# Patient Record
Sex: Female | Born: 1955
Health system: Southern US, Community
[De-identification: ages and names within clinical notes are randomized; demographics above are authoritative.]

## PROBLEM LIST (undated history)

## (undated) DIAGNOSIS — H269 Unspecified cataract: Secondary | ICD-10-CM

## (undated) DIAGNOSIS — I4891 Unspecified atrial fibrillation: Secondary | ICD-10-CM

## (undated) HISTORY — PX: ABDOMINAL HYSTERECTOMY: SHX81

## (undated) HISTORY — PX: CERVICAL DISC ARTHROPLASTY: SHX587

## (undated) HISTORY — PX: RIB FRACTURE SURGERY: SHX2358

## (undated) HISTORY — DX: Unspecified cataract: H26.9

## (undated) HISTORY — DX: Unspecified atrial fibrillation: I48.91

---

## 1998-04-06 ENCOUNTER — Other Ambulatory Visit: Admission: RE | Admit: 1998-04-06 | Discharge: 1998-04-06 | Payer: Self-pay | Admitting: *Deleted

## 1998-04-16 ENCOUNTER — Ambulatory Visit (HOSPITAL_COMMUNITY): Admission: RE | Admit: 1998-04-16 | Discharge: 1998-04-16 | Payer: Self-pay | Admitting: Neurological Surgery

## 1999-05-12 ENCOUNTER — Encounter (INDEPENDENT_AMBULATORY_CARE_PROVIDER_SITE_OTHER): Payer: Self-pay

## 1999-05-12 ENCOUNTER — Ambulatory Visit (HOSPITAL_COMMUNITY): Admission: RE | Admit: 1999-05-12 | Discharge: 1999-05-12 | Payer: Self-pay | Admitting: *Deleted

## 1999-05-30 ENCOUNTER — Other Ambulatory Visit: Admission: RE | Admit: 1999-05-30 | Discharge: 1999-05-30 | Payer: Self-pay | Admitting: *Deleted

## 1999-10-25 ENCOUNTER — Encounter: Admission: RE | Admit: 1999-10-25 | Discharge: 2000-01-23 | Payer: Self-pay | Admitting: Anesthesiology

## 1999-11-21 ENCOUNTER — Encounter: Admission: RE | Admit: 1999-11-21 | Discharge: 2000-02-19 | Payer: Self-pay | Admitting: Family Medicine

## 2000-04-23 ENCOUNTER — Encounter: Payer: Self-pay | Admitting: Family Medicine

## 2000-04-23 ENCOUNTER — Encounter: Admission: RE | Admit: 2000-04-23 | Discharge: 2000-04-23 | Payer: Self-pay | Admitting: Family Medicine

## 2000-06-26 ENCOUNTER — Other Ambulatory Visit: Admission: RE | Admit: 2000-06-26 | Discharge: 2000-06-26 | Payer: Self-pay | Admitting: *Deleted

## 2000-11-29 ENCOUNTER — Other Ambulatory Visit: Admission: RE | Admit: 2000-11-29 | Discharge: 2000-11-29 | Payer: Self-pay | Admitting: *Deleted

## 2000-11-29 ENCOUNTER — Encounter (INDEPENDENT_AMBULATORY_CARE_PROVIDER_SITE_OTHER): Payer: Self-pay

## 2002-01-19 ENCOUNTER — Other Ambulatory Visit: Admission: RE | Admit: 2002-01-19 | Discharge: 2002-01-19 | Payer: Self-pay | Admitting: *Deleted

## 2002-05-07 ENCOUNTER — Emergency Department (HOSPITAL_COMMUNITY): Admission: EM | Admit: 2002-05-07 | Discharge: 2002-05-08 | Payer: Self-pay | Admitting: Emergency Medicine

## 2002-08-10 ENCOUNTER — Ambulatory Visit (HOSPITAL_COMMUNITY): Admission: RE | Admit: 2002-08-10 | Discharge: 2002-08-10 | Payer: Self-pay | Admitting: *Deleted

## 2002-08-10 ENCOUNTER — Encounter (INDEPENDENT_AMBULATORY_CARE_PROVIDER_SITE_OTHER): Payer: Self-pay

## 2002-11-19 ENCOUNTER — Encounter: Payer: Self-pay | Admitting: Family Medicine

## 2002-11-19 ENCOUNTER — Encounter: Admission: RE | Admit: 2002-11-19 | Discharge: 2002-11-19 | Payer: Self-pay | Admitting: Family Medicine

## 2005-03-14 ENCOUNTER — Ambulatory Visit (HOSPITAL_COMMUNITY): Admission: RE | Admit: 2005-03-14 | Discharge: 2005-03-14 | Payer: Self-pay | Admitting: Orthopedic Surgery

## 2009-01-25 ENCOUNTER — Ambulatory Visit: Payer: Self-pay | Admitting: Cardiovascular Disease

## 2009-01-25 ENCOUNTER — Inpatient Hospital Stay (HOSPITAL_COMMUNITY): Admission: EM | Admit: 2009-01-25 | Discharge: 2009-02-18 | Payer: Self-pay | Admitting: Emergency Medicine

## 2009-01-25 ENCOUNTER — Ambulatory Visit: Payer: Self-pay | Admitting: Pulmonary Disease

## 2009-01-28 ENCOUNTER — Encounter (INDEPENDENT_AMBULATORY_CARE_PROVIDER_SITE_OTHER): Payer: Self-pay | Admitting: Ophthalmology

## 2009-02-01 ENCOUNTER — Ambulatory Visit: Payer: Self-pay | Admitting: Thoracic Surgery

## 2009-03-02 ENCOUNTER — Encounter: Payer: Self-pay | Admitting: Cardiology

## 2009-03-02 ENCOUNTER — Encounter: Admission: RE | Admit: 2009-03-02 | Discharge: 2009-03-02 | Payer: Self-pay | Admitting: Thoracic Surgery

## 2009-03-02 ENCOUNTER — Ambulatory Visit: Payer: Self-pay | Admitting: Thoracic Surgery

## 2009-03-02 DIAGNOSIS — T148XXA Other injury of unspecified body region, initial encounter: Secondary | ICD-10-CM | POA: Insufficient documentation

## 2009-03-02 DIAGNOSIS — D62 Acute posthemorrhagic anemia: Secondary | ICD-10-CM | POA: Insufficient documentation

## 2009-03-02 DIAGNOSIS — I4891 Unspecified atrial fibrillation: Secondary | ICD-10-CM | POA: Insufficient documentation

## 2009-03-03 ENCOUNTER — Ambulatory Visit: Payer: Self-pay | Admitting: Cardiology

## 2009-03-23 ENCOUNTER — Ambulatory Visit: Payer: Self-pay | Admitting: Thoracic Surgery

## 2009-03-23 ENCOUNTER — Encounter: Admission: RE | Admit: 2009-03-23 | Discharge: 2009-03-23 | Payer: Self-pay | Admitting: Thoracic Surgery

## 2009-03-24 ENCOUNTER — Telehealth (INDEPENDENT_AMBULATORY_CARE_PROVIDER_SITE_OTHER): Payer: Self-pay | Admitting: *Deleted

## 2009-05-04 ENCOUNTER — Encounter: Admission: RE | Admit: 2009-05-04 | Discharge: 2009-05-04 | Payer: Self-pay | Admitting: Thoracic Surgery

## 2009-05-04 ENCOUNTER — Ambulatory Visit: Payer: Self-pay | Admitting: Thoracic Surgery

## 2009-07-13 ENCOUNTER — Ambulatory Visit: Payer: Self-pay | Admitting: Thoracic Surgery

## 2009-07-13 ENCOUNTER — Encounter: Admission: RE | Admit: 2009-07-13 | Discharge: 2009-07-13 | Payer: Self-pay | Admitting: Thoracic Surgery

## 2009-10-11 ENCOUNTER — Ambulatory Visit: Payer: Self-pay | Admitting: Thoracic Surgery

## 2009-10-11 ENCOUNTER — Encounter: Admission: RE | Admit: 2009-10-11 | Discharge: 2009-10-11 | Payer: Self-pay | Admitting: Thoracic Surgery

## 2010-03-21 ENCOUNTER — Encounter: Admission: RE | Admit: 2010-03-21 | Discharge: 2010-03-21 | Payer: Self-pay | Admitting: Thoracic Surgery

## 2010-03-21 ENCOUNTER — Ambulatory Visit: Payer: Self-pay | Admitting: Thoracic Surgery

## 2011-01-31 LAB — GLUCOSE, CAPILLARY
Glucose-Capillary: 100 mg/dL — ABNORMAL HIGH (ref 70–99)
Glucose-Capillary: 102 mg/dL — ABNORMAL HIGH (ref 70–99)
Glucose-Capillary: 104 mg/dL — ABNORMAL HIGH (ref 70–99)
Glucose-Capillary: 106 mg/dL — ABNORMAL HIGH (ref 70–99)
Glucose-Capillary: 108 mg/dL — ABNORMAL HIGH (ref 70–99)
Glucose-Capillary: 113 mg/dL — ABNORMAL HIGH (ref 70–99)
Glucose-Capillary: 114 mg/dL — ABNORMAL HIGH (ref 70–99)
Glucose-Capillary: 121 mg/dL — ABNORMAL HIGH (ref 70–99)
Glucose-Capillary: 122 mg/dL — ABNORMAL HIGH (ref 70–99)
Glucose-Capillary: 124 mg/dL — ABNORMAL HIGH (ref 70–99)
Glucose-Capillary: 124 mg/dL — ABNORMAL HIGH (ref 70–99)
Glucose-Capillary: 124 mg/dL — ABNORMAL HIGH (ref 70–99)
Glucose-Capillary: 125 mg/dL — ABNORMAL HIGH (ref 70–99)
Glucose-Capillary: 126 mg/dL — ABNORMAL HIGH (ref 70–99)
Glucose-Capillary: 130 mg/dL — ABNORMAL HIGH (ref 70–99)
Glucose-Capillary: 132 mg/dL — ABNORMAL HIGH (ref 70–99)
Glucose-Capillary: 138 mg/dL — ABNORMAL HIGH (ref 70–99)
Glucose-Capillary: 142 mg/dL — ABNORMAL HIGH (ref 70–99)
Glucose-Capillary: 143 mg/dL — ABNORMAL HIGH (ref 70–99)
Glucose-Capillary: 147 mg/dL — ABNORMAL HIGH (ref 70–99)
Glucose-Capillary: 149 mg/dL — ABNORMAL HIGH (ref 70–99)
Glucose-Capillary: 151 mg/dL — ABNORMAL HIGH (ref 70–99)
Glucose-Capillary: 191 mg/dL — ABNORMAL HIGH (ref 70–99)
Glucose-Capillary: 80 mg/dL (ref 70–99)
Glucose-Capillary: 85 mg/dL (ref 70–99)
Glucose-Capillary: 96 mg/dL (ref 70–99)
Glucose-Capillary: 115 mg/dL — ABNORMAL HIGH (ref 70–99)
Glucose-Capillary: 117 mg/dL — ABNORMAL HIGH (ref 70–99)
Glucose-Capillary: 118 mg/dL — ABNORMAL HIGH (ref 70–99)
Glucose-Capillary: 120 mg/dL — ABNORMAL HIGH (ref 70–99)
Glucose-Capillary: 125 mg/dL — ABNORMAL HIGH (ref 70–99)
Glucose-Capillary: 127 mg/dL — ABNORMAL HIGH (ref 70–99)
Glucose-Capillary: 128 mg/dL — ABNORMAL HIGH (ref 70–99)
Glucose-Capillary: 128 mg/dL — ABNORMAL HIGH (ref 70–99)
Glucose-Capillary: 129 mg/dL — ABNORMAL HIGH (ref 70–99)
Glucose-Capillary: 129 mg/dL — ABNORMAL HIGH (ref 70–99)
Glucose-Capillary: 129 mg/dL — ABNORMAL HIGH (ref 70–99)
Glucose-Capillary: 130 mg/dL — ABNORMAL HIGH (ref 70–99)
Glucose-Capillary: 137 mg/dL — ABNORMAL HIGH (ref 70–99)
Glucose-Capillary: 137 mg/dL — ABNORMAL HIGH (ref 70–99)
Glucose-Capillary: 144 mg/dL — ABNORMAL HIGH (ref 70–99)
Glucose-Capillary: 146 mg/dL — ABNORMAL HIGH (ref 70–99)
Glucose-Capillary: 146 mg/dL — ABNORMAL HIGH (ref 70–99)
Glucose-Capillary: 146 mg/dL — ABNORMAL HIGH (ref 70–99)
Glucose-Capillary: 146 mg/dL — ABNORMAL HIGH (ref 70–99)
Glucose-Capillary: 147 mg/dL — ABNORMAL HIGH (ref 70–99)
Glucose-Capillary: 159 mg/dL — ABNORMAL HIGH (ref 70–99)
Glucose-Capillary: 163 mg/dL — ABNORMAL HIGH (ref 70–99)
Glucose-Capillary: 79 mg/dL (ref 70–99)
Glucose-Capillary: 82 mg/dL (ref 70–99)
Glucose-Capillary: 88 mg/dL (ref 70–99)
Glucose-Capillary: 96 mg/dL (ref 70–99)

## 2011-01-31 LAB — BASIC METABOLIC PANEL
BUN: 20 mg/dL (ref 6–23)
BUN: 10 mg/dL (ref 6–23)
BUN: 17 mg/dL (ref 6–23)
BUN: 18 mg/dL (ref 6–23)
BUN: 8 mg/dL (ref 6–23)
BUN: 8 mg/dL (ref 6–23)
CO2: 26 mEq/L (ref 19–32)
CO2: 26 mEq/L (ref 19–32)
CO2: 30 mEq/L (ref 19–32)
CO2: 30 mEq/L (ref 19–32)
Calcium: 7.6 mg/dL — ABNORMAL LOW (ref 8.4–10.5)
Calcium: 7.8 mg/dL — ABNORMAL LOW (ref 8.4–10.5)
Calcium: 6.7 mg/dL — ABNORMAL LOW (ref 8.4–10.5)
Calcium: 7.5 mg/dL — ABNORMAL LOW (ref 8.4–10.5)
Calcium: 7.7 mg/dL — ABNORMAL LOW (ref 8.4–10.5)
Calcium: 8 mg/dL — ABNORMAL LOW (ref 8.4–10.5)
Calcium: 8.2 mg/dL — ABNORMAL LOW (ref 8.4–10.5)
Calcium: 8.2 mg/dL — ABNORMAL LOW (ref 8.4–10.5)
Chloride: 102 mEq/L (ref 96–112)
Chloride: 106 mEq/L (ref 96–112)
Chloride: 107 mEq/L (ref 96–112)
Chloride: 111 mEq/L (ref 96–112)
Chloride: 111 mEq/L (ref 96–112)
Chloride: 113 mEq/L — ABNORMAL HIGH (ref 96–112)
Creatinine, Ser: 0.38 mg/dL — ABNORMAL LOW (ref 0.4–1.2)
Creatinine, Ser: 0.45 mg/dL (ref 0.4–1.2)
Creatinine, Ser: 0.5 mg/dL (ref 0.4–1.2)
Creatinine, Ser: 0.41 mg/dL (ref 0.4–1.2)
Creatinine, Ser: 0.46 mg/dL (ref 0.4–1.2)
Creatinine, Ser: 0.47 mg/dL (ref 0.4–1.2)
Creatinine, Ser: 0.5 mg/dL (ref 0.4–1.2)
Creatinine, Ser: 0.52 mg/dL (ref 0.4–1.2)
Creatinine, Ser: 0.54 mg/dL (ref 0.4–1.2)
Creatinine, Ser: 0.61 mg/dL (ref 0.4–1.2)
GFR calc Af Amer: >60 mL/min (ref >60)
GFR calc Af Amer: >60 mL/min (ref >60)
GFR calc Af Amer: >60 mL/min (ref >60)
GFR calc Af Amer: >60 mL/min (ref >60)
GFR calc Af Amer: >60 mL/min (ref >60)
GFR calc Af Amer: >60 mL/min (ref >60)
GFR calc Af Amer: >60 mL/min (ref >60)
GFR calc Af Amer: >60 mL/min (ref >60)
GFR calc Af Amer: >60 mL/min (ref >60)
GFR calc Af Amer: >60 mL/min (ref >60)
GFR calc Af Amer: >60 mL/min (ref >60)
GFR calc non Af Amer: >60 mL/min (ref >60)
GFR calc non Af Amer: >60 mL/min (ref >60)
GFR calc non Af Amer: >60 mL/min (ref >60)
GFR calc non Af Amer: >60 mL/min (ref >60)
GFR calc non Af Amer: >60 mL/min (ref >60)
GFR calc non Af Amer: >60 mL/min (ref >60)
GFR calc non Af Amer: >60 mL/min (ref >60)
GFR calc non Af Amer: >60 mL/min (ref >60)
GFR calc non Af Amer: >60 mL/min (ref >60)
Glucose, Bld: 121 mg/dL — ABNORMAL HIGH (ref 70–99)
Glucose, Bld: 133 mg/dL — ABNORMAL HIGH (ref 70–99)
Glucose, Bld: 138 mg/dL — ABNORMAL HIGH (ref 70–99)
Glucose, Bld: 141 mg/dL — ABNORMAL HIGH (ref 70–99)
Glucose, Bld: 150 mg/dL — ABNORMAL HIGH (ref 70–99)
Potassium: 3.5 mEq/L (ref 3.5–5.1)
Potassium: 3.7 mEq/L (ref 3.5–5.1)
Potassium: 4.1 mEq/L (ref 3.5–5.1)
Potassium: 3.5 mEq/L (ref 3.5–5.1)
Potassium: 3.9 mEq/L (ref 3.5–5.1)
Potassium: 4 mEq/L (ref 3.5–5.1)
Potassium: 4.1 mEq/L (ref 3.5–5.1)
Sodium: 138 mEq/L (ref 135–145)
Sodium: 138 mEq/L (ref 135–145)
Sodium: 143 mEq/L (ref 135–145)
Sodium: 144 mEq/L (ref 135–145)
Sodium: 145 mEq/L (ref 135–145)
Sodium: 146 mEq/L — ABNORMAL HIGH (ref 135–145)

## 2011-01-31 LAB — CBC
HCT: 22.5 % — ABNORMAL LOW (ref 36.0–46.0)
HCT: 22.8 % — ABNORMAL LOW (ref 36.0–46.0)
HCT: 26.3 % — ABNORMAL LOW (ref 36.0–46.0)
HCT: 30 % — ABNORMAL LOW (ref 36.0–46.0)
HCT: 23 % — ABNORMAL LOW (ref 36.0–46.0)
HCT: 25.4 % — ABNORMAL LOW (ref 36.0–46.0)
HCT: 25.8 % — ABNORMAL LOW (ref 36.0–46.0)
HCT: 32.2 % — ABNORMAL LOW (ref 36.0–46.0)
Hemoglobin: 8.2 g/dL — ABNORMAL LOW (ref 12.0–15.0)
Hemoglobin: 8.4 g/dL — ABNORMAL LOW (ref 12.0–15.0)
Hemoglobin: 9.9 g/dL — ABNORMAL LOW (ref 12.0–15.0)
Hemoglobin: 7.9 g/dL — CL (ref 12.0–15.0)
Hemoglobin: 8 g/dL — ABNORMAL LOW (ref 12.0–15.0)
Hemoglobin: 8.5 g/dL — ABNORMAL LOW (ref 12.0–15.0)
Hemoglobin: 8.8 g/dL — ABNORMAL LOW (ref 12.0–15.0)
MCHC: 33.7 g/dL (ref 30.0–36.0)
MCHC: 33.9 g/dL (ref 30.0–36.0)
MCHC: 34.3 g/dL (ref 30.0–36.0)
MCHC: 35 g/dL (ref 30.0–36.0)
MCHC: 34.6 g/dL (ref 30.0–36.0)
MCHC: 34.7 g/dL (ref 30.0–36.0)
MCHC: 34.9 g/dL (ref 30.0–36.0)
MCV: 93.8 fL (ref 78.0–100.0)
MCV: 94.7 fL (ref 78.0–100.0)
MCV: 95.1 fL (ref 78.0–100.0)
MCV: 95.3 fL (ref 78.0–100.0)
MCV: 96.1 fL (ref 78.0–100.0)
MCV: 96.3 fL (ref 78.0–100.0)
MCV: 96.7 fL (ref 78.0–100.0)
MCV: 97.8 fL (ref 78.0–100.0)
MCV: 98.2 fL (ref 78.0–100.0)
MCV: 98.3 fL (ref 78.0–100.0)
MCV: 99 fL (ref 78.0–100.0)
Platelets: 271 10*3/uL (ref 150–400)
Platelets: 121 10*3/uL — ABNORMAL LOW (ref 150–400)
Platelets: 132 10*3/uL — ABNORMAL LOW (ref 150–400)
Platelets: 168 10*3/uL (ref 150–400)
Platelets: 176 10*3/uL (ref 150–400)
Platelets: 178 10*3/uL (ref 150–400)
Platelets: 250 10*3/uL (ref 150–400)
Platelets: 256 10*3/uL (ref 150–400)
RBC: 2.34 MIL/uL — ABNORMAL LOW (ref 3.87–5.11)
RBC: 2.37 MIL/uL — ABNORMAL LOW (ref 3.87–5.11)
RBC: 2.49 MIL/uL — ABNORMAL LOW (ref 3.87–5.11)
RBC: 2.63 MIL/uL — ABNORMAL LOW (ref 3.87–5.11)
RBC: 2.78 MIL/uL — ABNORMAL LOW (ref 3.87–5.11)
RBC: 3.11 MIL/uL — ABNORMAL LOW (ref 3.87–5.11)
RBC: 3.16 MIL/uL — ABNORMAL LOW (ref 3.87–5.11)
RBC: 2.38 MIL/uL — ABNORMAL LOW (ref 3.87–5.11)
RBC: 2.52 MIL/uL — ABNORMAL LOW (ref 3.87–5.11)
RBC: 2.63 MIL/uL — ABNORMAL LOW (ref 3.87–5.11)
RBC: 2.64 MIL/uL — ABNORMAL LOW (ref 3.87–5.11)
RBC: 3.47 MIL/uL — ABNORMAL LOW (ref 3.87–5.11)
RDW: 15 % (ref 11.5–15.5)
RDW: 14.1 % (ref 11.5–15.5)
RDW: 14.4 % (ref 11.5–15.5)
RDW: 14.6 % (ref 11.5–15.5)
RDW: 14.6 % (ref 11.5–15.5)
RDW: 15.1 % (ref 11.5–15.5)
RDW: 15.3 % (ref 11.5–15.5)
RDW: 15.6 % — ABNORMAL HIGH (ref 11.5–15.5)
RDW: 15.8 % — ABNORMAL HIGH (ref 11.5–15.5)
WBC: 10.1 10*3/uL (ref 4.0–10.5)
WBC: 10.4 10*3/uL (ref 4.0–10.5)
WBC: 10.7 10*3/uL — ABNORMAL HIGH (ref 4.0–10.5)
WBC: 11.3 10*3/uL — ABNORMAL HIGH (ref 4.0–10.5)
WBC: 11.3 10*3/uL — ABNORMAL HIGH (ref 4.0–10.5)
WBC: 12.1 10*3/uL — ABNORMAL HIGH (ref 4.0–10.5)
WBC: 8 10*3/uL (ref 4.0–10.5)
WBC: 9.8 10*3/uL (ref 4.0–10.5)
WBC: 10.3 10*3/uL (ref 4.0–10.5)
WBC: 6.2 10*3/uL (ref 4.0–10.5)
WBC: 9.1 10*3/uL (ref 4.0–10.5)
WBC: 9.3 10*3/uL (ref 4.0–10.5)

## 2011-01-31 LAB — BLOOD GAS, ARTERIAL
Acid-Base Excess: 2.8 mmol/L — ABNORMAL HIGH (ref 0.0–2.0)
Acid-Base Excess: 5.4 mmol/L — ABNORMAL HIGH (ref 0.0–2.0)
Acid-base deficit: 1.8 mmol/L (ref 0.0–2.0)
Acid-base deficit: 2.8 mmol/L — ABNORMAL HIGH (ref 0.0–2.0)
Bicarbonate: 22.2 mEq/L (ref 20.0–24.0)
Bicarbonate: 22.5 mEq/L (ref 20.0–24.0)
Bicarbonate: 27.3 mEq/L — ABNORMAL HIGH (ref 20.0–24.0)
Bicarbonate: 29.7 mEq/L — ABNORMAL HIGH (ref 20.0–24.0)
Bicarbonate: 29.8 mEq/L — ABNORMAL HIGH (ref 20.0–24.0)
FIO2: 0.4 %
FIO2: 0.4 %
FIO2: 0.5 %
MECHVT: 450 mL
MECHVT: 450 mL
MECHVT: 450 mL
MECHVT: 450 mL
MECHVT: 500 mL
MECHVT: 500 mL
MECHVT: 500 mL
O2 Saturation: 91.9 %
O2 Saturation: 95.9 %
O2 Saturation: 96.1 %
O2 Saturation: 96.4 %
O2 Saturation: 97 %
O2 Saturation: 97.7 %
PEEP: 0.5 cmH2O
PEEP: 5 cmH2O
PEEP: 5 cmH2O
PEEP: 5 cmH2O
PEEP: 5 cmH2O
PEEP: 8 cmH2O
PEEP: 8 cmH2O
Patient temperature: 100.9
Patient temperature: 97.4
Patient temperature: 98.6
Patient temperature: 98.6
Patient temperature: 98.6
RATE: 16 resp/min
RATE: 16 resp/min
RATE: 18 resp/min
TCO2: 23.5 mmol/L (ref 0–100)
TCO2: 23.6 mmol/L (ref 0–100)
TCO2: 31.2 mmol/L (ref 0–100)
TCO2: 31.3 mmol/L (ref 0–100)
pCO2 arterial: 41.7 mmHg (ref 35.0–45.0)
pCO2 arterial: 42.8 mmHg (ref 35.0–45.0)
pCO2 arterial: 44 mmHg (ref 35.0–45.0)
pCO2 arterial: 45.8 mmHg — ABNORMAL HIGH (ref 35.0–45.0)
pCO2 arterial: 50.7 mmHg — ABNORMAL HIGH (ref 35.0–45.0)
pH, Arterial: 7.335 — ABNORMAL LOW (ref 7.350–7.400)
pH, Arterial: 7.392 (ref 7.350–7.400)
pH, Arterial: 7.44 — ABNORMAL HIGH (ref 7.350–7.400)
pO2, Arterial: 64.8 mmHg — ABNORMAL LOW (ref 80.0–100.0)
pO2, Arterial: 65.8 mmHg — ABNORMAL LOW (ref 80.0–100.0)
pO2, Arterial: 72.5 mmHg — ABNORMAL LOW (ref 80.0–100.0)
pO2, Arterial: 79 mmHg — ABNORMAL LOW (ref 80.0–100.0)
pO2, Arterial: 85.3 mmHg (ref 80.0–100.0)

## 2011-01-31 LAB — POCT I-STAT 3, ART BLOOD GAS (G3+)
Bicarbonate: 23.6 mEq/L (ref 20.0–24.0)
Bicarbonate: 24.4 mEq/L — ABNORMAL HIGH (ref 20.0–24.0)
O2 Saturation: 92 %
Patient temperature: 100.2
Patient temperature: 98.4
TCO2: 25 mmol/L (ref 0–100)
TCO2: 28 mmol/L (ref 0–100)
TCO2: 29 mmol/L (ref 0–100)
pCO2 arterial: 40.2 mmHg (ref 35.0–45.0)
pH, Arterial: 7.416 — ABNORMAL HIGH (ref 7.350–7.400)
pH, Arterial: 7.431 — ABNORMAL HIGH (ref 7.350–7.400)
pH, Arterial: 7.453 — ABNORMAL HIGH (ref 7.350–7.400)
pO2, Arterial: 66 mmHg — ABNORMAL LOW (ref 80.0–100.0)
pO2, Arterial: 75 mmHg — ABNORMAL LOW (ref 80.0–100.0)
pO2, Arterial: 84 mmHg (ref 80.0–100.0)

## 2011-01-31 LAB — COMPREHENSIVE METABOLIC PANEL
AST: 12 U/L (ref 0–37)
AST: 37 U/L (ref 0–37)
Albumin: 1.2 g/dL — ABNORMAL LOW (ref 3.5–5.2)
Albumin: 1.4 g/dL — ABNORMAL LOW (ref 3.5–5.2)
Albumin: 2.1 g/dL — ABNORMAL LOW (ref 3.5–5.2)
Alkaline Phosphatase: 162 U/L — ABNORMAL HIGH (ref 39–117)
Alkaline Phosphatase: 82 U/L (ref 39–117)
BUN: 10 mg/dL (ref 6–23)
BUN: 14 mg/dL (ref 6–23)
CO2: 25 mEq/L (ref 19–32)
Calcium: 7.1 mg/dL — ABNORMAL LOW (ref 8.4–10.5)
Calcium: 7.2 mg/dL — ABNORMAL LOW (ref 8.4–10.5)
Chloride: 105 mEq/L (ref 96–112)
Creatinine, Ser: 0.45 mg/dL (ref 0.4–1.2)
Creatinine, Ser: 0.57 mg/dL (ref 0.4–1.2)
Creatinine, Ser: 0.57 mg/dL (ref 0.4–1.2)
GFR calc Af Amer: >60 mL/min (ref >60)
GFR calc Af Amer: >60 mL/min (ref >60)
GFR calc non Af Amer: >60 mL/min (ref >60)
GFR calc non Af Amer: >60 mL/min (ref >60)
Glucose, Bld: 128 mg/dL — ABNORMAL HIGH (ref 70–99)
Glucose, Bld: 150 mg/dL — ABNORMAL HIGH (ref 70–99)
Potassium: 3.8 mEq/L (ref 3.5–5.1)
Potassium: 3.9 mEq/L (ref 3.5–5.1)
Total Bilirubin: 0.4 mg/dL (ref 0.3–1.2)
Total Bilirubin: 0.5 mg/dL (ref 0.3–1.2)
Total Protein: 4.2 g/dL — ABNORMAL LOW (ref 6.0–8.3)
Total Protein: 4.2 g/dL — ABNORMAL LOW (ref 6.0–8.3)

## 2011-01-31 LAB — TYPE AND SCREEN

## 2011-01-31 LAB — DIFFERENTIAL
Basophils Absolute: 0 10*3/uL (ref 0.0–0.1)
Basophils Absolute: 0 10*3/uL (ref 0.0–0.1)
Basophils Relative: 0 % (ref 0–1)
Eosinophils Absolute: 0.1 10*3/uL (ref 0.0–0.7)
Eosinophils Relative: 2 % (ref 0–5)
Lymphocytes Relative: 13 % (ref 12–46)
Lymphocytes Relative: 18 % (ref 12–46)
Lymphocytes Relative: 18 % (ref 12–46)
Lymphs Abs: 1 10*3/uL (ref 0.7–4.0)
Lymphs Abs: 1.1 10*3/uL (ref 0.7–4.0)
Monocytes Absolute: 0.5 10*3/uL (ref 0.1–1.0)
Monocytes Absolute: 0.9 10*3/uL (ref 0.1–1.0)
Monocytes Relative: 5 % (ref 3–12)
Monocytes Relative: 10 % (ref 3–12)
Monocytes Relative: 14 % — ABNORMAL HIGH (ref 3–12)
Neutro Abs: 8.6 10*3/uL — ABNORMAL HIGH (ref 1.7–7.7)
Neutro Abs: 4 10*3/uL (ref 1.7–7.7)
Neutro Abs: 6.9 10*3/uL (ref 1.7–7.7)
Neutrophils Relative %: 85 % — ABNORMAL HIGH (ref 43–77)
Neutrophils Relative %: 65 % (ref 43–77)
Neutrophils Relative %: 76 % (ref 43–77)

## 2011-01-31 LAB — BRAIN NATRIURETIC PEPTIDE
Pro B Natriuretic peptide (BNP): 219 pg/mL — ABNORMAL HIGH (ref 0.0–100.0)
Pro B Natriuretic peptide (BNP): 249 pg/mL — ABNORMAL HIGH (ref 0.0–100.0)

## 2011-01-31 LAB — CULTURE, BLOOD (ROUTINE X 2)

## 2011-01-31 LAB — URINALYSIS, ROUTINE W REFLEX MICROSCOPIC
Bilirubin Urine: NEGATIVE
Glucose, UA: NEGATIVE mg/dL
Ketones, ur: NEGATIVE mg/dL
Ketones, ur: NEGATIVE mg/dL
Protein, ur: 30 mg/dL — AB
Protein, ur: NEGATIVE mg/dL
Urobilinogen, UA: 0.2 mg/dL (ref 0.0–1.0)
Urobilinogen, UA: 1 mg/dL (ref 0.0–1.0)

## 2011-01-31 LAB — CROSSMATCH
ABO/RH(D): O POS
ABO/RH(D): O POS
Antibody Screen: NEGATIVE

## 2011-01-31 LAB — POCT I-STAT 7, (LYTES, BLD GAS, ICA,H+H)
Acid-Base Excess: 7 mmol/L — ABNORMAL HIGH (ref 0.0–2.0)
Calcium, Ion: 1.1 mmol/L — ABNORMAL LOW (ref 1.12–1.32)
HCT: 23 % — ABNORMAL LOW (ref 36.0–46.0)
O2 Saturation: 99 %
Potassium: 3.7 mEq/L (ref 3.5–5.1)
Sodium: 137 mEq/L (ref 135–145)
pCO2 arterial: 42.7 mmHg (ref 35.0–45.0)
pH, Arterial: 7.473 — ABNORMAL HIGH (ref 7.350–7.400)
pO2, Arterial: 144 mmHg — ABNORMAL HIGH (ref 80.0–100.0)

## 2011-01-31 LAB — PROTIME-INR
INR: 1 (ref 0.00–1.49)
Prothrombin Time: 12.9 seconds (ref 11.6–15.2)

## 2011-01-31 LAB — URINE CULTURE
Colony Count: NO GROWTH
Colony Count: NO GROWTH
Special Requests: NEGATIVE

## 2011-01-31 LAB — HEMOGLOBIN A1C: Hgb A1c MFr Bld: 5.4 % (ref 4.6–6.1)

## 2011-01-31 LAB — RETICULOCYTES
RBC.: 2.52 MIL/uL — ABNORMAL LOW (ref 3.87–5.11)
RBC.: 2.63 MIL/uL — ABNORMAL LOW (ref 3.87–5.11)
RBC.: 3.19 MIL/uL — ABNORMAL LOW (ref 3.87–5.11)
Retic Count, Absolute: 55.2 10*3/uL (ref 19.0–186.0)
Retic Count, Absolute: 55.4 10*3/uL (ref 19.0–186.0)
Retic Ct Pct: 2.1 % (ref 0.4–3.1)
Retic Ct Pct: 3.3 % — ABNORMAL HIGH (ref 0.4–3.1)

## 2011-01-31 LAB — HEMOGLOBIN AND HEMATOCRIT, BLOOD
HCT: 24.1 % — ABNORMAL LOW (ref 36.0–46.0)
HCT: 26.1 % — ABNORMAL LOW (ref 36.0–46.0)
Hemoglobin: 8.5 g/dL — ABNORMAL LOW (ref 12.0–15.0)

## 2011-01-31 LAB — ABO/RH: ABO/RH(D): O POS

## 2011-01-31 LAB — POCT I-STAT, CHEM 8
BUN: 11 mg/dL (ref 6–23)
Chloride: 109 mEq/L (ref 96–112)
HCT: 44 % (ref 36.0–46.0)
Sodium: 139 mEq/L (ref 135–145)

## 2011-01-31 LAB — MAGNESIUM
Magnesium: 1.8 mg/dL (ref 1.5–2.5)
Magnesium: 2.4 mg/dL (ref 1.5–2.5)
Magnesium: 2.5 mg/dL (ref 1.5–2.5)

## 2011-01-31 LAB — CULTURE, BAL-QUANTITATIVE W GRAM STAIN: Colony Count: >=100000

## 2011-01-31 LAB — TSH: TSH: 2.985 u[IU]/mL (ref 0.350–4.500)

## 2011-01-31 LAB — POCT CARDIAC MARKERS
Myoglobin, poc: >500 ng/mL (ref 12–200)
Troponin i, poc: <0.05 ng/mL (ref 0.00–0.09)

## 2011-01-31 LAB — URINE MICROSCOPIC-ADD ON

## 2011-01-31 LAB — CK TOTAL AND CKMB (NOT AT ARMC): Total CK: 277 U/L — ABNORMAL HIGH (ref 7–177)

## 2011-01-31 LAB — VITAMIN B12: Vitamin B-12: 303 pg/mL (ref 211–911)

## 2011-02-04 IMAGING — CT CT ANGIO CHEST
2 of 6 series · 18 of 36 positions shown · IV contrast (APPLIED)
Comparison: Plain film chest earlier today.  Chest CT of
02/03/2009.

CLINICAL DATA: Unexplained hypoxemia.  Multiple injury from fall
01/25/2009.  Posterior shoulder pain.  The patient denies shortness
of breath or chest pain.

CT Angiography of the Chest.
TECHNIQUE: Multidetector CT angiography of the chest was performed
after contrast with bolus timed to evaluate the pulmonary arteries.
Multiplanar CT image reconstructions including MIPs were obtained
to evaluate the vascular anatomy.
Contrast:  100 ml [id number]

[Series 8: pulm embolism 1.0 b25f thins · axial · 0.64mm/px · z∈[-300,-33]mm · 17 of 297 slices shown]
[im 15/297  lung]
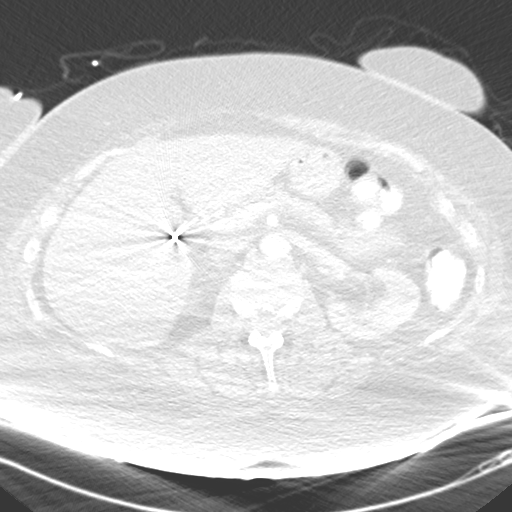
[im 30/297  mediastinal]
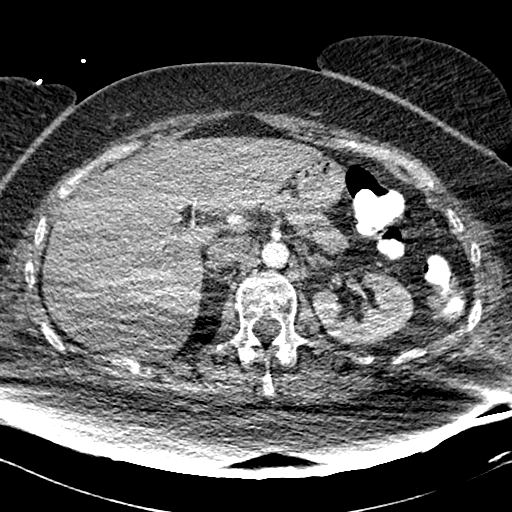
[im 45/297  lung]
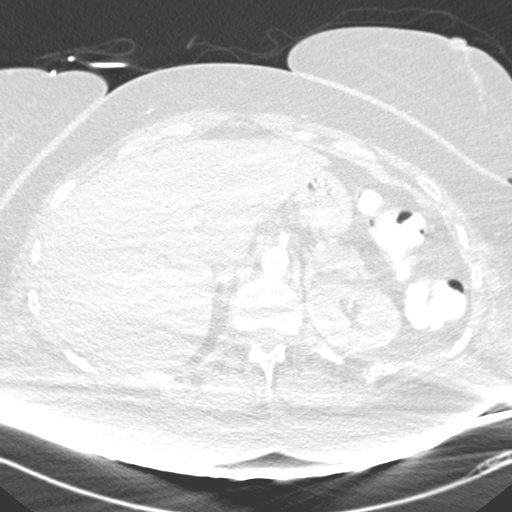
[im 60/297  mediastinal]
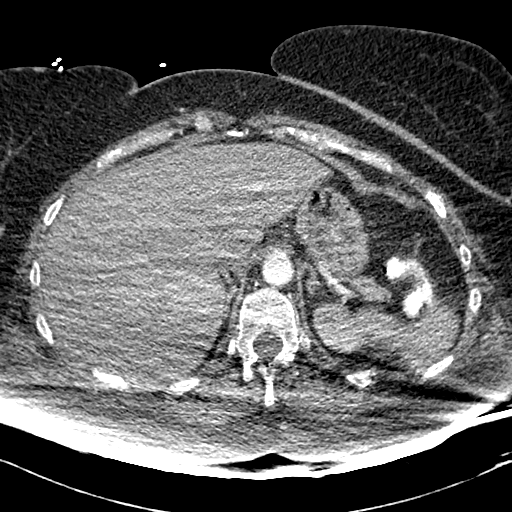
[im 89/297  lung]
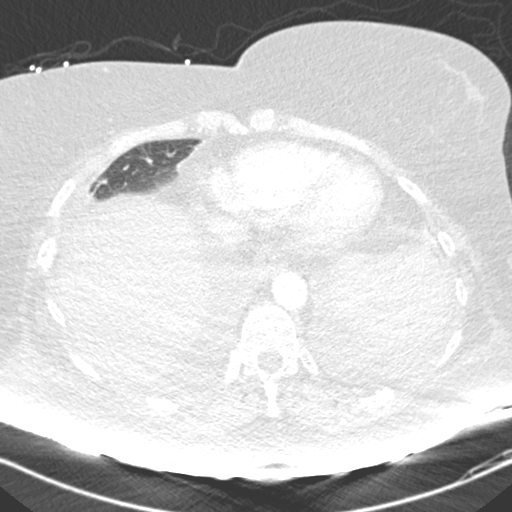
[im 104/297  mediastinal]
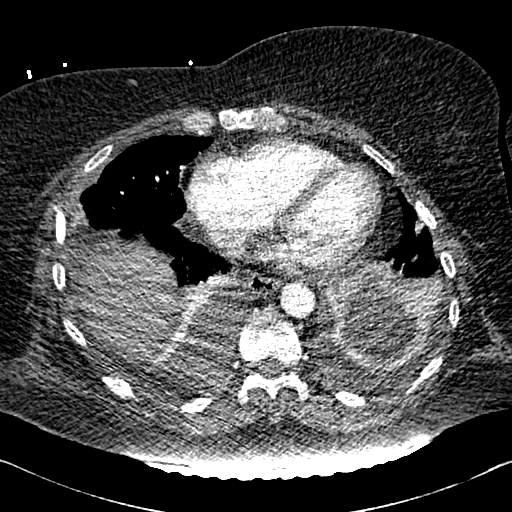
[im 119/297  lung]
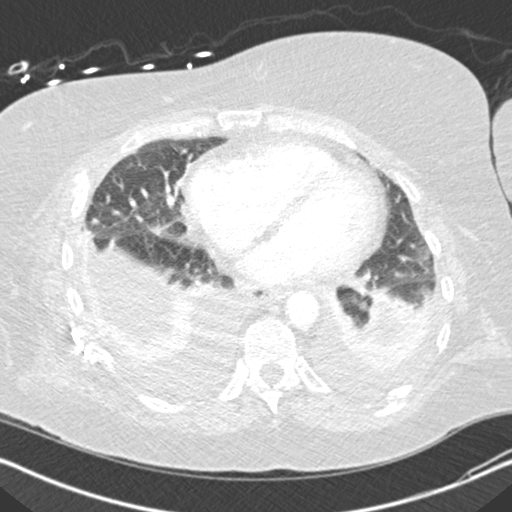
[im 134/297  mediastinal]
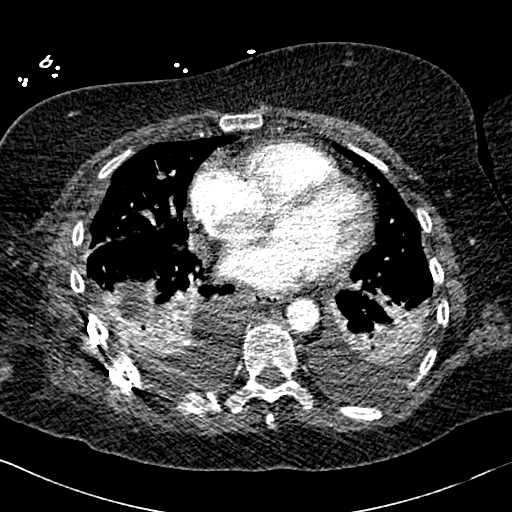
[im 149/297  lung]
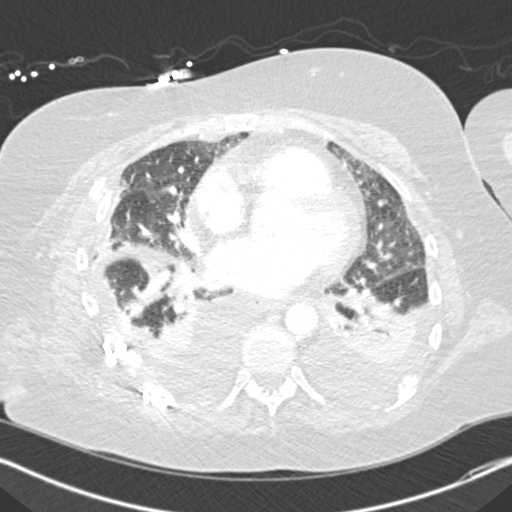
[im 163/297  mediastinal]
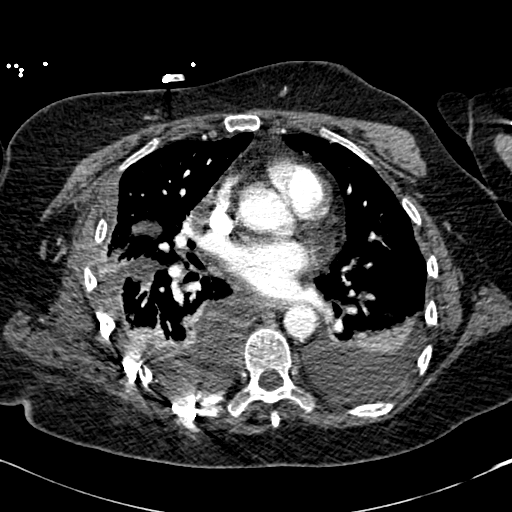
[im 178/297  lung]
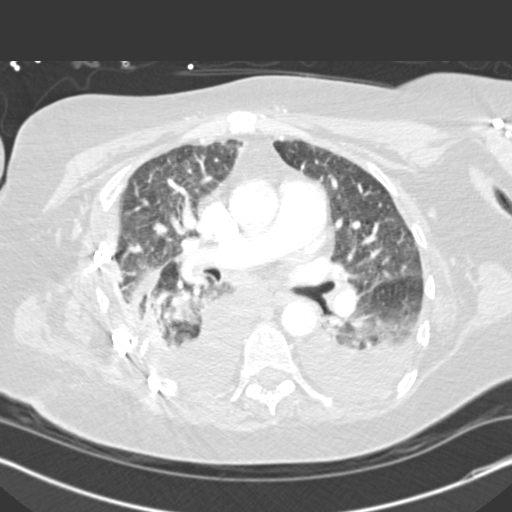
[im 193/297  mediastinal]
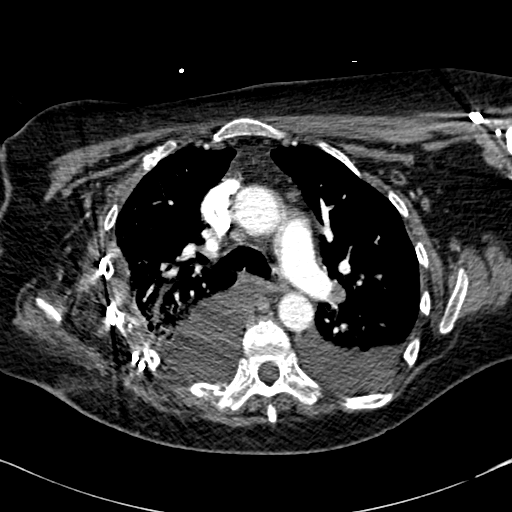
[im 208/297  lung]
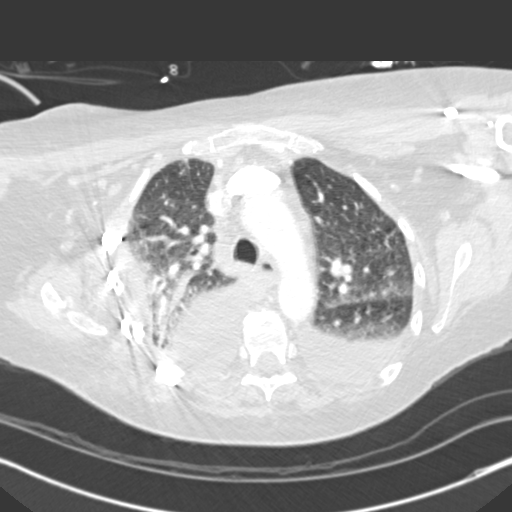
[im 237/297  mediastinal]
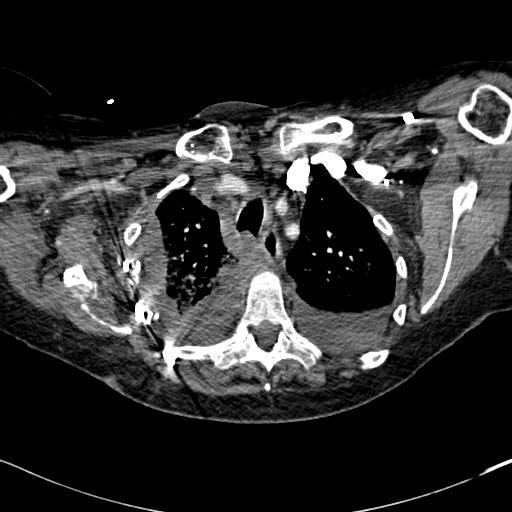
[im 252/297  lung]
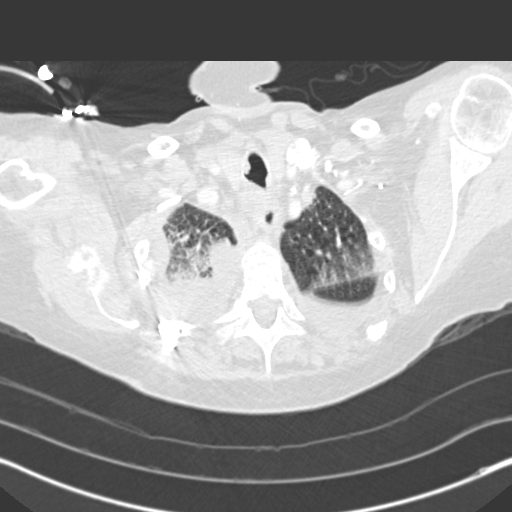
[im 267/297  mediastinal]
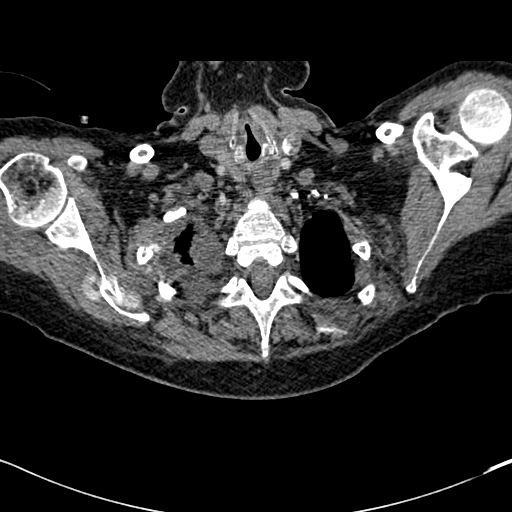
[im 282/297  lung]
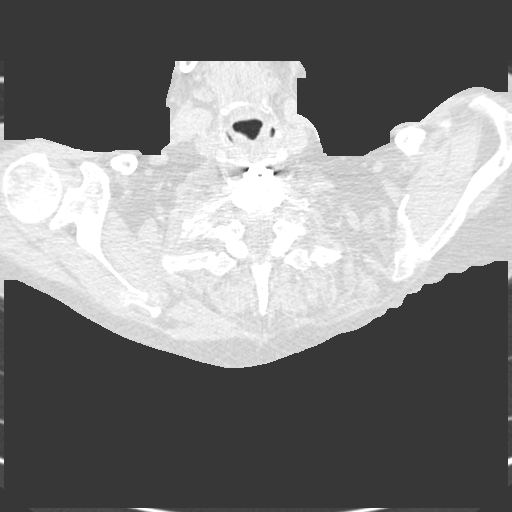

[Series 602: coronals · coronal · 0.64mm/px · 1 of 69 slices shown]
[im 35/69  mediastinal]
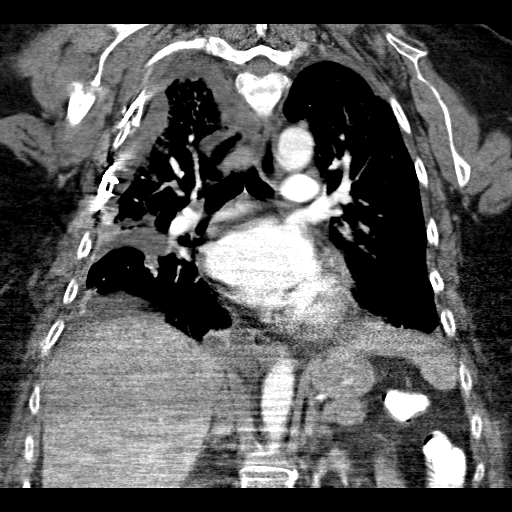

[18 of 36 positions shown; findings below may reference images not displayed]

FINDINGS: Lung windows demonstrate mild to moderate motion
artifact.  Septal thickening in the upper lobes which is
progressive.  Right lower lobe airspace disease which is minimally
improved.  This is suspicious for infection.  Improved left base
aeration with likely atelectasis remaining. No pneumothorax.

Soft tissue windows:  The quality of this exam for evaluation of
pulmonary embolism is poor to moderate.  The primary limitations
include motion artifact, the patient's size, and artifact from
interval fixation of numerous rib fractures.  Evaluation of the
right greater than left lung bases especially limited/suboptimal.
No evidence of central or lobar embolism.  Smaller emboli cannot be
excluded.

Normal aortic caliber without dissection.  Moderate cardiomegaly.
Small left pleural effusion is simple and layers posteriorly.
Increase in small right pleural effusion.  This has areas of
loculation superiorly and laterally on image 26 of series 7 and
superiorly and posterior medially on image 30 of series 7.

Prominent prevascular new mediastinal lymph nodes which are likely
reactive.  Tiny hiatal hernia.  No hilar adenopathy

Limited abdominal imaging demonstrates degradation due to artifact
external to patient.  Cholecystectomy.

Right scapular comminuted fracture as described previously.  Distal
left clavicular fracture.  Healing left rib fractures.  Interval
fixation of many of the right-sided rib fractures.  Accentuated
thoracic kyphosis

 Review of the MIP images confirms the above findings.
IMPRESSION: 1.  Poor to moderate quality exam for evaluation of pulmonary
embolism.  No large embolism.

2.  Worsened upper lobe aeration likely related to pulmonary edema.
3.  Improved bibasilar aeration.  Right-sided infection and left
base atelectasis remaining.
4.  Increased right-sided pleural effusion with areas of
loculation.  Similar simple appearing left-sided pleural effusion.
5.  Numerous fractures throughout the chest with interval fixation
of  right-sided rib fractures.

## 2011-03-06 NOTE — Letter (Signed)
March 23, 2009   Cherylynn Ridges, M.D.  [phone], [address]  [place], [place] [address]   Re:  Wendy Brown, Wendy Brown              DOB:  08-27-1956   Dear Vonna Kotyk:   I saw the patient in the office today and she continues to do better.  She has had some posterior back pain, but her chest x-ray shows markedly  improvement with resolution of her effusions.  Her blood pressure was  127/77, pulse 72, respirations 18, and saturations were 98%.  I plan to  see her back again in 6 weeks with another chest x-ray since we will  have her oxygen stopped at home.  She is making Brown remarkable recovery.   Ines Bloomer, M.D.  Electronically Signed   DPB/MEDQ  D:  03/23/2009  T:  03/24/2009  Job:  [id number]

## 2011-03-06 NOTE — Letter (Signed)
Mar 02, 2009   Marta Lamas. Lindie Spruce, M.D.  [address], [address]  [place], [place] [address]   Re:  ZAILEY, AUDIA A              DOB:  1956/08/17   Dear Dr. Lindie Spruce:   The patient came today and is doing well.  Her blood pressure was  110/70, pulse 60, respirations 18, and saturations were 94%.  Her  incisions are well healed.  We did a open reduction and internal  fixation of sixth rib on the right.  Since her saturations were 99%, I  dropped her oxygen to 1.5 liters and told her to gradually do without  the oxygen and just use it at night or when she felt short of breath.  I  will see her back again in 3 weeks and at that time we will probably  discontinue the oxygen.  I will get another x-ray in 3 weeks.  From my  standpoint, she is doing remarkably well considering the magnitude of  her injuries.   Ines Bloomer, M.D.  Electronically Signed   DPB/MEDQ  D:  03/02/2009  T:  03/03/2009  Job:  [id number]   cc:   Everardo Beals. Juanda Chance, MD, [facility]

## 2011-03-06 NOTE — Assessment & Plan Note (Signed)
OFFICE VISIT   Wendy Brown, Wendy Brown  DOB:  [birth year]                                        October 11, 2009  CHART #:  [id number]   The patient came today and her chest x-ray showed further improvement.  Her lungs are clear to auscultation and percussion.  All the rib plates  were in good position.  She still has some pain in her right arm, but  overall this has improved also.  Her only complaint was some drainage  from her coccygeal area, and I told that she should probably see Dr.  Lindie Spruce regarding this.  I will see her back again in 6 months with Brown  chest x-ray.   Ines Bloomer, M.D.  Electronically Signed   DPB/MEDQ  D:  10/11/2009  T:  10/11/2009  Job:  [id number]   cc:   Jimmye Norman, MD

## 2011-03-06 NOTE — Op Note (Signed)
Wendy Brown, Wendy Brown              ACCOUNT NO.:  [id number]   MEDICAL RECORD NO.:  [id number]          PATIENT TYPE:  INP   LOCATION:  2032                         FACILITY:  [facility]   PHYSICIAN:  Ines Bloomer, M.D. DATE OF BIRTH:  1956/10/20   DATE OF PROCEDURE:  02/04/2009  DATE OF DISCHARGE:  02/18/2009                               OPERATIVE REPORT   PREOPERATIVE DIAGNOSIS:  Multiple rib fractures with flail chest, right  chest.   POSTOPERATIVE DIAGNOSIS:  Multiple rib fractures with flail chest, right  chest.   OPERATION PERFORMED:  Open reduction and internal fixation of third  through eighth ribs on the right.   SURGEON:  Ines Bloomer, M.D.   FIRST ASSISTANT:  Rowe Clack, P.A.-C.   General anesthesia.   This patient developed trauma to the right chest after a fall and had a  flail segment involving really the fourth through the eighth ribs at the  anterior axillary line.  Because of the instability and being on the  respirator, she is brought to the operating room for open reduction and  internal fixation of the ribs.  She was turned to the right lateral  thoracotomy position.  Incision was (made in the right posterolateral)  started at the anterior axillary line and going posteriorly.  Dissection  was carried down to the latissimus which was divided and then the  serratus was reflected up superiorly to expose the third, fourth, fifth,  sixth and seventh rib for fracture with the fifth, sixth and seventh  ribs were having multiple segments.  These were dissected free and to  start rib plating, we first dissected out the third rib and I decided to  use a splint on this and using the drill.  Coming in through a counter  incision, we were able to drill the hole in the medial portion of the  rib and then used the splint guide to create a passageway in the matrix  of the rib.  We then were able to run the splint from the splint hole  through the middle of the rib  or the marrow of the rib from the medial  segment to the lateral segment.  Once this rib was split and then  drilled the hole for the screw and attempted anchored with screw but  this would not work so, we ended up wiring this with a steel wire.  We  then started on the eighth rib and used the eighth rib plate and  measured it appropriately to go over three segments and measured it to  accommodate a 12 screw and then used the drill with a stop guide to  drill several holes into the rib and then using the automatic  screwdriver to fix the rib and reduce the fracture.  A chest tube was  brought in through a lateral incision and placed into the right chest.  We then did the seventh rib in like manner using the seventh rib plate  and using either 10 or 12 screws.  The fourth rib was then done next and  because it was underneath  his scapula, we had to use a counter incision  with the stab blade in order to drill and also to apply the screws, but  we were able to stabilize the fracture at the anterior axillary line and  then going posteriorly underneath the scapula.  In like manner, we did  the fifth and sixth ribs.  In this way, we were able to use the plates  and used either 10 or 12 screws in each one after doing an internal  fixation.  When all plates had been placed, we then put a Jackson-Pratt  in this area and  brought out through a separate stab wound and then the chest was closed  with #1 Vicryl and the muscle layer with 2-0 Vicryl and subcutaneous  tissue and Ethicon skin clips.  A single On-Q was inserted in the usual  fashion.  The patient returned on the ventilator to 2100.      Ines Bloomer, M.D.  Electronically Signed     Ines Bloomer, M.D.  Electronically Signed    DPB/MEDQ  D:  05/12/2009  T:  05/13/2009  Job:  [id number]

## 2011-03-06 NOTE — Assessment & Plan Note (Signed)
OFFICE VISIT   HIND, CHESLER A  DOB:  1956/01/18                                        Mar 21, 2010  CHART #:  [id number]   Blood pressure was 117/82, pulse 100, respirations 18, sats were 97%.  She is doing well overall, and her chest x-ray shows that her rib  platings are in good position.  She still has some weakness of the right  arm secondary to her ulnar nerve palsy.  Overall though considering  everything, she is doing remarkably well.  She has lost 15 pounds in the  last couple of months.  I will see her again in 6 months with a chest x-  ray.   Ines Bloomer, M.D.  Electronically Signed   DPB/MEDQ  D:  03/21/2010  T:  03/22/2010  Job:  [id number]

## 2011-03-06 NOTE — Assessment & Plan Note (Signed)
OFFICE VISIT   [name], Wendy Brown  DOB:  Mar 15, 1956                                        May 04, 2009  CHART #:  [id number]   The patient came for followup today.  Her chest x-ray showed minimal  atelectasis and you can see the rib fixations.  She is doing well.  Her  right arm fracture is healing better, and she is able to move her right  arm.  She is still having some moderate pain but this is improving.  I  will see her again in 2 months with Brown chest x-ray.   Ines Bloomer, M.D.  Electronically Signed   DPB/MEDQ  D:  05/04/2009  T:  05/05/2009  Job:  [id number]

## 2011-03-06 NOTE — Consult Note (Signed)
Wendy Brown, LOUDIN NO.:  [id number]   MEDICAL RECORD NO.:  [id number]          PATIENT TYPE:  INP   LOCATION:  3109                         FACILITY:  [facility]   PHYSICIAN:  Brayton El, MD    DATE OF BIRTH:  09/25/1956   DATE OF CONSULTATION:  01/27/2009  DATE OF DISCHARGE:                                 CONSULTATION   PRIMARY CARDIOLOGIST:  None.   REASON FOR CONSULTATION:  The patient has had some brief episodes of  increased heart rate up into the 170s, lasting less than 12 seconds.   HISTORY OF PRESENT ILLNESS:  This is a 55 year old female with history  of thoracic outlet obstruction and possible history of cholecystectomy,  but no known history of coronary artery disease or prior arrhythmia, who  is admitted primarily for silver and then gold level trauma after a fall  and subsequent need for intubation.  We are consulted for what was  thought to be paroxysmal atrial fibrillation with rapid ventricular  response as well as sinus tachycardia in the low 100s.  The patient has  had a few episodes of what looks like supraventricular tachycardia or  possibly atrial fibrillation with rapid ventricular response, but always  in the setting of sinus tachycardia and brief, lasting less than 12  seconds.   PAST MEDICAL HISTORY:  1. Thoracic outlet obstruction.  2. History of cholecystectomy.   SOCIAL HISTORY:  Per ED report, the patient is a nonsmoker, nondrinker.  However, no history able to be given by the patient due to intubation  and sedation.   FAMILY HISTORY:  See social history.   REVIEW OF SYSTEMS:  See social history.   ALLERGIES:  NKDA per ED report.   MEDICATIONS:  1. Albuterol inhaler q.4 h.  2. Cephazolin 1 g IV q.6 h.  3. Chlorhexidine 15 mL p.o. b.i.d.  4. Colace 100 mg by NG b.i.d.  5. Hetastarch/NaCl 0.9% 500 mL IV x1.  6. Insulin sliding scale 190 units subcu injection q.4 h.  7. Ipratropium bromide 0.5 mg inhaler q.4 h.  8.  Lactose-free nutrition/fiber 1000 mL IV q.24 h.  9. Pantoprazole 40 mg IV daily.  10.Perox-A-Mint 1 application topical.  11.Potassium chloride 30 mEq IV x1.  12.Senna 5 mL by NG b.i.d.  13.Normal saline sodium bolus 500 mL given once and 1000 mL given      once.  14.Tylenol 650 mg by NG tube q.4 h.  15.Fentanyl 1 bag continue IV drip.  16.Haloperidol 5 mg IV q.20 minutes p.r.n.  17.Diltiazem 10 mg by IV x1.  18.Versed 50 mg IV continuous.  19.Morphine 1 mg IV q.2h. p.r.n.  20.Ondansetron 4 mg IV q.6 h.   PHYSICAL EXAMINATION:  VITAL SIGNS:  Temperature 100.9 degrees  Fahrenheit, pulse 101, BP 101/44, respiration rate 18, and O2 saturation  is 97%, on 60% by ventilator.  I's and O's 4715/1030.  Urine output 780,  chest tube 250.  GENERAL:  The patient is intubated and sedated.  HEENT:  She is normocephalic with a posterior scalp contusion.  Nares  seem patent.  Dentition cannot be evaluated.  Oropharynx cannot be  evaluated secondary to being the patient being intubated and sedated.  NECK:  Supple.  No thyromegaly.  JVD difficult to assess given neck  brace and intubation and sedation.  HEART:  Heart rate was regular with audible S1 and S2.  No clicks, rubs,  murmurs, or gallops.  Pulses are 2+ and equal in both upper and lower  extremities.  LUNGS:  Bilateral rhonchi with good air movement with intubation.  SKIN:  No rashes, lesions, or petechiae.  ABDOMEN:  Soft and nondistended.  The patient did not respond to  abdominal exam, likely nontender.  No hepatosplenomegaly.  EXTREMITIES:  No clubbing, cyanosis, or edema.  Right upper extremity in  a brace secondary to right humerus fracture.  The rest of the  musculoskeletal exam cannot be done to the patient being intubated and  sedated and lying on her back.  NEURO:  It could also not be done secondary to the patient's condition.   RADIOLOGY:  The patient had chest x-ray that showed significant  increased bibasilar opacity  consistent with effusions, atelectasis, or  infiltrates.  The patient had a EKG, it showed a rhythm of sinus  tachycardia with a rate of 110 bpm.  Normal axis.  No evidence of  hypertrophy.  No acute ST-T wave changes and no significant Q-waves.  No  significant change from EKG from January 25, 2009.  PR 168, QRS 76, QTc  419.   LABORATORY DATA:  HGB 8.5, HCT 24.1, sodium 143, potassium 3.1, chloride  115, CO2 of 23, BUN 6, creatinine 0.57, and glucose 0.17.  First set of  point-of-care markers were negative.  First full set of cardiac enzymes  were also negative.  Albumin 2.1 and calcium 7.2.  Urinalysis is  positive.   ASSESSMENT/PLAN:  This is a 55 year old female with no significant past  medical history who was admitted on January 25, 2009, for a fall and  significant trauma including right humerus fracture, posterior scalp  contusion, multiple right rib fractures, right pneumothorax for which  she now has a chest tube and a small subarachnoid hemorrhage.  The  patient was noted to be in sinus tachycardia with brief episodes of  paroxysmal atrial fibrillation with RVR versus multifocal atrial  tachycardia.  The patient is hemodynamically stable and SVT is very  brief.  I would not add  any medications at this point.  We would keep potassium greater than 4  and magnesium greater than 2.  We will check an echo tomorrow and  continue to follow.  This was discussed with the patient's daughter.  Thank you for the consult.      Jarrett Ables, [facility]      Brayton El, MD  Electronically Signed    MS/MEDQ  D:  01/27/2009  T:  01/28/2009  Job:  [id number]

## 2011-03-06 NOTE — Consult Note (Signed)
Wendy Brown, Wendy Brown              ACCOUNT NO.:  [id number]   MEDICAL RECORD NO.:  [id number]          PATIENT TYPE:  EMS   LOCATION:  MAJO                         FACILITY:  [facility]   PHYSICIAN:  Hilda Lias, M.D.   DATE OF BIRTH:  August 01, 1956   DATE OF CONSULTATION:  01/25/2009  DATE OF DISCHARGE:                                 CONSULTATION   HISTORY OF PRESENT ILLNESS:  Wendy Brown is a 55 year old female who fell  off from her attic.  She was brought immediately to the emergency room.  She had a quite a bit of respiratory distress.  She was oriented  according to the trauma surgeon, she was 15/15.  Because of respiratory  status, she had to be intubated and a chest tube was inserted.  CT scan  of the cervical area as well as the neck was obtained.  By the time I  saw Wendy Brown, she was already sedated and she was on the ventilator.  The pupils are equal, reactive, 3 mm.  There is no evidence of any blood  or CSF coming from the nose, from the ear.  There was no Battle sign.  The rest of the physical examination was unable secondary to sedation.   The cervical CT scan is negative for fracture.  The CT scan of the head  showed small area of blood in the left parietal area, is minimal, and  there is no evidence of any swelling, most likely traumatic.   CLINICAL IMPRESSION:  1. Multiple trauma.  2. Closed head injury with a minimal left parietal contusion.   RECOMMENDATIONS:  The patient is going to be admitted to the Trauma  Service.  We are going to follow her.  At the present time, there is no  further neurosurgical care except observation.  We will repeat the CT  scan in 48 hours or 72 hours or before as needed.           ______________________________  Hilda Lias, M.D.     EB/MEDQ  D:  01/25/2009  T:  01/26/2009  Job:  [id number]

## 2011-03-06 NOTE — Consult Note (Signed)
Wendy Brown, Wendy Brown              ACCOUNT NO.:  [id number]   MEDICAL RECORD NO.:  [id number]          PATIENT TYPE:  INP   LOCATION:  3109                         FACILITY:  [facility]   PHYSICIAN:  Madlyn Frankel. Charlann Boxer, M.D.  DATE OF BIRTH:  [birth year]   DATE OF CONSULTATION:  01/25/2009  DATE OF DISCHARGE:                                 CONSULTATION   CHIEF COMPLAINT:  Right humerus fracture.   BRIEF HISTORY:  Wendy Brown is a 55 year old female brought into the  emergency room as a gold trauma after a fall of at least 20 feet through  an attic floor landed on her right side.  At the time of the evaluation,  she had complained or right-sided rib pain as well as right arm  discomfort.   She was initially seen and evaluated in the emergency room and in the  trauma service, was consulted by the trauma team for evaluation of the  right humerus.  She was subsequently intubated due to concern for  pulmonary contusion.  Seen and evaluated by both, neurosurgery and an  orthopedics at this point.   At this point, I have limited information regarding other complaints of  her upper and lower extremities.   PAST MEDICAL HISTORY:  History of thoracic outlet obstruction.   SURGICAL HISTORY:  Negative.   SOCIAL HISTORY:  Denies smoking.  No alcohol and drug use.   ALLERGIES:  No known drug allergies.   MEDICATIONS:  None.   REVIEW OF SYSTEMS:  She reports shortness of breath and chest pain, but  no gastrointestinal or genitourinary issues.   ORTHOPEDIC EXAMINATION:  This evening, she was intubated and sedated on  the ventilator.  Prior to her sedation, she was reportedly awake, alert,  and oriented.   The right upper extremity is currently in a balloon splint with arm out,  fully extended.  She does appear to be a little bit large in her upper  extremity and thoracic area.   As for her lower extremities, she is flexed in the bed.  There is no  gross deformity or bruising.   Further right  upper extremity examination indicates a palpable pulse,  brisk capillary refill.  Neurologic exam is not possible based on her  sedated state, particularly evaluating the radial nerve.   Radiographs that were obtained indicated no evidence of any pelvic  injury.  She does have evidence of a mid shaft transverse displaced  humerus fracture.   ASSESSMENT:  Closed right mid shaft humerus fracture displaced.   PLAN:  Wendy Brown at this point has a challenging situation.  Mid shaft  humerus fractures were often treated in nonoperative fashion.  However,  in a supine position, it is difficulty to apply a splint.   From an Orthopedic standpoint, the plan at this point would be to try to  treat this nonoperatively.  This would be chosen particularly if we will  get her a more upright, in a sitting position and then upright walking.  At that point, she can be placed into either a coaptation splint and/or  a Sarmiento-type splint versus  a hanging arm cast in an attempt to close  reduce this into a more anatomic position.  If she remains ventilated  and may be in a supine position for sometime due to pulmonary contusions  and rib fractures, then operative consideration may be undertaken.   At this point, I am going to follow her alone, may even have one of our  upper extremity surgeons follow along Dr. Bradly Bienenstock to evaluate  surgical considerations.   She will also need an orthopedic secondary survey to at least evaluate  for any other complaints with radiographic evaluation as well as  radiographic consideration.      Madlyn Frankel Charlann Boxer, M.D.  Electronically Signed     MDO/MEDQ  D:  01/26/2009  T:  01/26/2009  Job:  [id number]

## 2011-03-06 NOTE — Op Note (Signed)
NAMEGIRTRUDE, ENSLIN              ACCOUNT NO.:  [id number]   MEDICAL RECORD NO.:  [id number]          PATIENT TYPE:  INP   LOCATION:  3109                         FACILITY:  [facility]   PHYSICIAN:  Gabrielle Dare. Janee Morn, M.D.DATE OF BIRTH:  1955/12/22   DATE OF PROCEDURE:  01/28/2009  DATE OF DISCHARGE:                               OPERATIVE REPORT   FAST ULTRASOUND PROCEDURE   HISTORY OF PRESENT ILLNESS:  Ms. Sand is a 55 year old female who was  admitted to the Trauma Service on January 25, 2009.  She has had a gradual  drop in her hemoglobin.  Initial abdomen and pelvis CT scan did not  reveal any solid organ injury or evidence of bleeding.  We are doing a  followup fast ultrasound to ensure she has no internal bleeding.   PROCEDURE IN DETAIL:  The patient remains intubated in the intensive  care unit.  Her abdomen was imaged with ultrasound at the bedside.  First, Morison pouch in the right upper quadrant was visualized showing  no fluid between the right kidney and the liver.  Next, her epigastrium  was visualized demonstrating the pericardium with no pericardial  effusion.  Next, the left upper quadrant was visualized demonstrating no  fluid between the spleen and the left kidney.  Finally, the pelvis area  was visualized demonstrating no free fluid next to the bladder.   IMPRESSION:  Negative fast ultrasound.  The patient tolerated the  procedure well.      Gabrielle Dare Janee Morn, M.D.  Electronically Signed     BET/MEDQ  D:  01/28/2009  T:  01/29/2009  Job:  [id number]

## 2011-03-06 NOTE — Discharge Summary (Signed)
NAMESADIA, BELFIORE NO.:  [id number]   MEDICAL RECORD NO.:  [id number]          PATIENT TYPE:  INP   LOCATION:  2032                         FACILITY:  [facility]   PHYSICIAN:  Gabrielle Dare. Janee Morn, M.D.DATE OF BIRTH:  1956-05-05   DATE OF ADMISSION:  01/25/2009  DATE OF DISCHARGE:  02/18/2009                               DISCHARGE SUMMARY   DISCHARGE DIAGNOSES:  1. Fall from attic  2. Traumatic brain injury.  3. Subarachnoid hemorrhage.  4. Multiple bilateral rib fractures with hemothorax and pulmonary      contusion.  5. Right clavicle fracture.  6. Right humerus fracture.  7. Scalp laceration.  8. Lumbar spine transverse process fractures.  9. Acute blood loss anemia.  10.Atrial fibrillation.   CONSULTANTS:  1. Madlyn Frankel Charlann Boxer, MD, for Orthopedic Surgery.  2. Hilda Lias, MD, for Neurosurgery.  3. Ines Bloomer, MD, for Cardiothoracic Surgery.  4. Oley Balm. Sung Amabile, MD, for pulmonology.  5. Bruce R. Juanda Chance, MD, [facility], for [facility].   PROCEDURES:  1. Repair of scalp laceration by Dr. Luisa Hart.  2. Open reduction and internal fixation of multiple right rib      fractures by Dr. Edwyna Shell.   HISTORY OF PRESENT ILLNESS:  This is a 55 year old white female who fell  approximately 20 feet through an attic floor onto her right side.  She  came in as a silver trauma alert and was upgraded to gold in the  emergency department.  She complains of right chest pain.  She was  intubated in the emergency department secondary to hypoxemia and  tachypnea.  She had an episode of hypotension which responded well to  about a liter of IV fluid.  Workup demonstrated the above-mentioned  injuries and she was admitted to the Intensive Care Unit, and Orthopedic  Surgery and Neurosurgery were consulted.   HOSPITAL COURSE:  The patient's subarachnoid hemorrhage did not get  worse and was minimal to begin with.  There was some thought early on  about operative fixation  of some of the patient's orthopedic injuries,  but it was eventually decided to treat these conservatively by Dr. Charlann Boxer.  Dr. Edwyna Shell was consulted early because of the significance of the rib  fractures and thought plating of the right side would benefit the  patient.  A couple of days after admission, the patient developed atrial  fibrillation with rapid ventricular response.  [facility] was  consulted.  The patient spontaneously converted, but kept going into  AFib and so she was eventually stabilized on amiodarone which maintained  her in sinus rhythm.  We continued to try to wean the patient on the  ventilator while we were waiting for operative fixation of the ribs.  This was carried out and she was again weaned.  Eventually, she was able  to be extubated and aside from significant hypoxemia, off oxygen, she  did very well.  Therapies were started at this time and she progressed  with those slowly.  She does have a fair amount of family support and as  the patient did not want admission to Inpatient Rehab we are  able to  send her home in good condition initially in the care of her sister.  During her hospital stay, she did develop acute blood loss anemia which  was treated conservatively with iron and Aranesp.  This did not require  transfusion.  She also was treated empirically for unknown infection  secondary to fevers and elevated white count.  However, cultures failed  to grow anything and antibiotics were stopped without complication.  Because the patient remained hypoxic off oxygen, a pulmonary consult was  obtained and Dr. Sung Amabile saw her.  Essentially, they recommended  continued treatment with supplemental oxygen.  She was discharged home  on this.   DISCHARGE MEDICATIONS:  The patient will resume her home medications of  Prevacid 30 mg as needed and erythromycin eye ointment twice daily.  In  addition she is to take:  1. Darvocet-N 100 one p.o. q.4 h. p.r.n. pain,  #80 with no refill.  2. Iron sulfate 325 mg 1 p.o. b.i.d., #60 with no refill.  3. Metoprolol 12.5 mg 1 p.o. b.i.d., #60 with no refill.  4. Amiodarone 200 mg 1 p.o. b.i.d., #30 with no refill.   FOLLOWUP:  The patient will need to follow up with Dr. Edwyna Shell, Dr.  Sung Amabile, Dr. Charlann Boxer, and [facility].  She will call their offices  for appointments.  Followup with the Trauma Service will be on an as-  needed basis.      Earney Hamburg, P.A.      Gabrielle Dare Janee Morn, M.D.  Electronically Signed    MJ/MEDQ  D:  02/18/2009  T:  02/18/2009  Job:  [id number]   cc:   Ines Bloomer, M.D.  Oley Balm Sung Amabile, MD  Madlyn Frankel Charlann Boxer, M.D.

## 2011-03-06 NOTE — Assessment & Plan Note (Signed)
OFFICE VISIT   Wendy Brown, Wendy Brown  DOB:  11/14/1955                                        July 13, 2009  CHART #:  [id number]   The patient return for today and her chest x-ray shows excellent  position of her rib fixation.  She still has limited mobility of her  right arm secondary to her humerus fracture, and there is some mobility  of her shoulder, but this has improved since we saw her last.  She still  has to do most things left-handed because of the weakness in her right  arm.  She has continued to follow up with orthopedist on that.  She has  had the amiodarone stopped, and is still on metoprolol 12.5 mg twice Brown  day, and we will also stop the iron.  She continues on hydrocodone.  I  will plan to see her back again in 3 months with another chest x-ray.  Her blood pressure was 110/80, pulse 100, respirations 18, sats were  95%.   Ines Bloomer, M.D.  Electronically Signed   DPB/MEDQ  D:  07/13/2009  T:  07/14/2009  Job:  [id number]

## 2011-03-06 NOTE — H&P (Signed)
Wendy Brown, Wendy Brown              ACCOUNT NO.:  [id number]   MEDICAL RECORD NO.:  [id number]          PATIENT TYPE:  INP   LOCATION:  3109                         FACILITY:  [facility]   PHYSICIAN:  Maisie Fus A. Cornett, M.D.DATE OF BIRTH:  [birth year]   DATE OF ADMISSION:  01/25/2009  DATE OF DISCHARGE:                              HISTORY & PHYSICAL   CHIEF COMPLAINT:  Fall.   HISTORY OF PRESENT ILLNESS:  The patient is a 55 year old female who  fell about 20 feet to her attic forward on to her right chest.  She was  brought in initially to sliver trauma, which is upgraded to a gold  trauma.  Chief complaint was chest pain, shortness of breath, and right  arm pain.  She had to be intubated in the emergency room due to  hypoxemia and tachypnea.  This was done by the EDT.  She had 1 episode  of blood pressure into the 80s, which responded well to bolus fluid  administration.   PAST MEDICAL HISTORY:  Thoracic outlet obstruction.   PAST SURGICAL HISTORY:  She does have a right upper quadrant scar from  cholecystectomy, looks like.   SOCIAL HISTORY:  Denies tobacco or alcohol use.   ALLERGIES:  None.   FAMILY HISTORY:  Noncontributory.   MEDICATIONS:  None.   REVIEW OF SYSTEMS:  Positive for chest pain, shortness of breath,  otherwise 15-point review of systems negative.   PHYSICAL EXAMINATION:  VITAL SIGNS:  Temperature 97, pulse 118, blood  pressure 156/70, respiratory rate is 30-40, sats 85% on 100% face  shield.  SKIN:  She has a contusion/abrasion to her posterior scalp.  HEAD:  Otherwise, no deformity noted.  EYES:  Pupils were 3 mm reactive bilaterally.  EARS:  Normal.  FACE:  Mid face is stable.  NECK:  Cervical collar.  PULMONARY:  Decreased breath sounds on the right.  Obvious chest wall  deformity.  Tenderness to palpation with crepitus.  No obvious flail.  CARDIOVASCULAR:  Extremities warm, well perfused.  Heart rate, regular  rate without rub, murmur, or gallop.  ABDOMEN:  Soft, nontender without rebound, guarding, or mass.  Pelvis is  stable.  RECTAL:  Normal.  MUSCULOSKELETAL:  Deformity of the right upper extremity.  BACK:  Normal.  NEUROLOGIC:  Glasgow coma scale upon presentation was 15 prior to  intubation.   Labs are pending.  Chest x-rays is poor quality, questionable right  pulmonary contusion, right rib fractures, and right pneumothorax.  Pelvis shows no fracture.  Extremities shows right mid shaft humerus  fracture.  CT scan of the head shows a small amount of subarachnoid  blood.  CT scan of her neck shows some postsurgical changes of what  appears to be a spinal fusion, otherwise no acute process.  Chest shows  a large right hemopneumothorax, with minimal deviation of the  mediastinal structures.  There is some mediastinal air and subcutaneous  air noted.  Abdomen and pelvis shows no free fluid or evidence of lumbar  sacral fracture.   IMPRESSION:  1. Fall.  2. Right humerus fracture.  3. Posterior scalp  contusion.  4. Right rib fractures, multiple.  5. Right pneumohemothorax.  6. Small subarachnoid hemorrhage.   PLAN:  Chest tube will be placed in the right side.  This will be  dictated on a separate report.  ICU admission.  We will have  Neurosurgery see for her subarachnoid hemorrhage and Orthopedics for her  right humeral fracture.  She will remain sedated and intubated due to  her right pulmonary contusion, right hemopneumothorax, and multiple  right rib fractures.      Thomas A. Cornett, M.D.  Electronically Signed     TAC/MEDQ  D:  01/25/2009  T:  01/26/2009  Job:  [id number]

## 2011-03-06 NOTE — Op Note (Signed)
Wendy Brown, Wendy Brown              ACCOUNT NO.:  [id number]   MEDICAL RECORD NO.:  [id number]          PATIENT TYPE:  INP   LOCATION:  3109                         FACILITY:  [facility]   PHYSICIAN:  Maisie Fus A. Cornett, M.D.DATE OF BIRTH:  [birth year]   DATE OF PROCEDURE:  DATE OF DISCHARGE:                               OPERATIVE REPORT   PREOPERATIVE DIAGNOSIS:  Right hemopneumothorax.   POSTOPERATIVE DIAGNOSIS:  Right hemopneumothorax.   PROCEDURE:  Placement of right 32-French chest tube.   SURGEON:  Maisie Fus A. Cornett, MD   ANESTHESIA:  Propofol infusion with 1% lidocaine.   ESTIMATED BLOOD LOSS:  Minimal.   INDICATIONS FOR PROCEDURE:  The patient is a 52-year female who fell,  has multiple right rib fractures and a large right hemopneumothorax.  She requires a chest tube for decompression of this.   DESCRIPTION OF PROCEDURE:  The patient was placed supine.  The right  axillary region was prepped and draped in sterile fashion.  Incision was  made and dissection was carried down to her thoracic cavity.  She is  extremely obese and this was very difficult, but I was able to palpate  what I thought was probably the fourth or the fifth or sixth interspace.  She had significant crepitance through her chest wall and instability  from multiple rib fractures.  I used a hemostat and was able to separate  the intercostal muscles and enter the right thoracic cavity with a rush  of air and blood.  I then guided a 32-French chest tube using my finger  into the thoracic space.  This was very difficult due to her obesity,  but I was able to get this into place.  I sutured it to the skin using 0  silk.  This was placed to 20 cm of suction.  Chest x-ray revealed the  tip of the tube to be in the mid right chest.      Maisie Fus A. Cornett, M.D.  Electronically Signed     TAC/MEDQ  D:  01/25/2009  T:  01/26/2009  Job:  [id number]

## 2011-03-09 NOTE — Op Note (Signed)
Wendy Brown, Wendy Brown              ACCOUNT NO.:  [id number]   MEDICAL RECORD NO.:  [id number]          PATIENT TYPE:  AMB   LOCATION:  DAY                          FACILITY:  [facility]   PHYSICIAN:  Marlowe Kays, M.D.  DATE OF BIRTH:  1956/08/27   DATE OF PROCEDURE:  03/14/2005  DATE OF DISCHARGE:                                 OPERATIVE REPORT   PREOPERATIVE DIAGNOSIS:  1.  Medial shelf plica.  2.  Chondromalacia, medial facet patella, left knee.   POSTOPERATIVE DIAGNOSIS:  1.  Medial shelf plica.  2.  Chondromalacia, medial facet patella, left knee.   OPERATION:  Left knee arthroscopy with excision of medial shelf plica and  shaving of medial facet patella.   SURGEON:  Marlowe Kays, M.D.   ASSISTANT:  Nurse.   ANESTHESIA:  General.   PATHOLOGY AND JUSTIFICATION FOR PROCEDURE:  She has patellofemoral type pain  with subluxation views showing no subluxation of her patella. A MRI showing  the above findings. There was also question of possible posterior horn tear  of the medial meniscus.   PROCEDURE:  Satisfactory general anesthetic. Pneumatic tourniquet. Leg was  Esmarched out nonsterilely, thigh stabilizer. Left leg was prepped with  DuraPrep from stabilizer to ankle with DuraPrep and draped in a sterile  field. Right leg was protected with knee protector and Ace wrap. Superior  medial saline inflow. First, from anterior and lateral portal, medial  compartment, knee joint was evaluated. She had a fairly exuberant amount of  synovium anteriorly covering the medial meniscus which could have been an  impingement problem, and I resected this with a 3.5 shaver. Her medial  meniscus looked normal on probing, and the joint looked normal. The ACL was  intact. I looked at the medial gutter and suprapatellar area and did find a  large plica and adjacent to it the full thickness chondromalacia of the  medial facet of the patella. I debrided down the patella and resected the  plica with a 3.5 shaver. Pre and post films were taken. I then reversed  portals. The lateral compartment of the knee joint looked normal. I then  looked up in the lateral gutter and suprapatellar area, with the problem  being corrected as well. The knee joint was then irrigated until clear, and  fluid possible removed. The portals were closed with 4-0 nylon, 20 cc of  half percent Marcaine with adrenal, 4 mg of morphine were then instilled  through the inflow apparatus, which  was removed, and this portal closed with 4-0 Nylon as well. Betadine,  Adaptic dressings were applied. Tourniquet was released. She tolerated the  procedure well and taken to the recovery room in satisfactory condition with  no known complications.      JA/MEDQ  D:  03/14/2005  T:  03/14/2005  Job:  [id number]

## 2011-03-09 NOTE — Op Note (Signed)
   Wendy Brown, Wendy Brown                        ACCOUNT NO.:  [id number]   MEDICAL RECORD NO.:  [id number]                   PATIENT TYPE:  AMB   LOCATION:  ENDO                                 FACILITY:  [facility]   PHYSICIAN:  Georgiana Spinner, M.D.                 DATE OF BIRTH:  [birth year]   DATE OF PROCEDURE:  08/10/2002  DATE OF DISCHARGE:                                 OPERATIVE REPORT   PROCEDURE:  Upper endoscopy.   INDICATIONS:  GERD.   ANESTHESIA:  Demerol 50 mg, Versed 5 mg.   DESCRIPTION OF PROCEDURE:  With the patient mildly sedated in the left  lateral decubitus position, the Olympus videoscopic endoscope inserted in  the mouth, passed under direct vision through the esophagus, which appeared  normal until we reached the distal esophagus and the Z-line was somewhat  irregular, indicating possible changes of esophagitis, which were  photographed and biopsied.  We entered into the stomach through a hiatal  hernia.  The fundus, body, antrum, duodenal bulb, second portion of the  duodenum were normal.  From this point the endoscope was slowly withdrawn,  taking circumferential views of the entire duodenal mucosa visualized until  the endoscope had been pulled back in the stomach, placed in retroflexion to  view the stomach from below.  An incomplete wrap of the GE junction around  the endoscope was noted, indicating a laxity here that could contribute to  her reflux.  The endoscope was straightened and withdrawn, taking  circumferential views of the remaining gastric and esophageal mucosa.  The  patient's vital signs and pulse oximetry remained stable.  The patient  tolerated the procedure well without apparent complications.   FINDINGS:  Small to moderate hiatal hernia with laxity of the GE junction  indicating propensity to reflux, otherwise an unremarkable examination other  than some inflammatory changes in the distal esophagus.   PLAN:  Await biopsy report.  The  patient will call me for results and follow  up with me as an outpatient.  Proceed to colonoscopy as planned.                                               Georgiana Spinner, M.D.    GMO/MEDQ  D:  08/10/2002  T:  08/10/2002  Job:  [id number]   cc:   Almedia Balls. Fore, M.D.  [phone] [address] [place]  [place] [address]  Fax: [phone]

## 2011-03-09 NOTE — Op Note (Signed)
   NAMERHYLEI, Wendy Brown                        ACCOUNT NO.:  [id number]   MEDICAL RECORD NO.:  [id number]                   PATIENT TYPE:  AMB   LOCATION:  ENDO                                 [facility]ACILITY[facility]   PHYSICIAN:  Georgiana Spinner, M.D.                 DATE OF BIRTH:  1956/02/10   DATE OF PROCEDURE:  DATE OF DISCHARGE:                                 OPERATIVE REPORT   PROCEDURE:  Colonoscopy.   INDICATIONS FOR PROCEDURE:  Rectal bleeding attributable to hemorrhoids,  colon cancer screening.  Patient with symptoms of irritable bowel syndrome.   ANESTHESIA:  Demerol 30, Versed 2 mg.   DESCRIPTION OF PROCEDURE:  With the patient mildly sedated in the left  lateral decubitus position, a rectal exam was performed which was  unremarkable. Subsequently, the Olympus videoscopic colonoscope was inserted  in the rectum and passed under direct vision to the cecum identified by the  ileocecal valve and appendiceal orifice both of which were photographed and  appeared normal. From this point, the endoscope was then turned to enter  into the terminal ileum which also appeared normal and was photographed.  From this point the colonoscope was slowly withdrawn taking circumferential  views of the remaining colonic mucosa stopping then only in the rectum which  appeared normal in direct and showed hemorrhoids on retroflexed view. The  endoscope straightened and withdrawn. The patient's vital signs and pulse  oximeter remained stable. The patient tolerated the procedure well without  apparent complications.   FINDINGS:  Internal hemorrhoids, otherwise, an unremarkable colonoscopic  examination including the terminal ileum.   PLAN:  See endoscopy note. Have the patient follow-up with me as an  outpatient to deal the IBS symptomatology.                                                Georgiana Spinner, M.D.    GMO/MEDQ  D:  08/10/2002  T:  08/10/2002  Job:  [id number]   cc:   Almedia Balls.  Fore, M.D.  [phone] [address] [place]  [place] [address]  Fax: [phone]

## 2015-03-08 ENCOUNTER — Other Ambulatory Visit: Payer: Self-pay | Admitting: Family Medicine

## 2015-03-08 ENCOUNTER — Ambulatory Visit
Admission: RE | Admit: 2015-03-08 | Discharge: 2015-03-08 | Disposition: A | Discharge status: Home / Self Care | Payer: 59 | Source: Ambulatory Visit | Attending: Family Medicine | Admitting: Family Medicine

## 2015-03-08 DIAGNOSIS — S4991XD Unspecified injury of right shoulder and upper arm, subsequent encounter: Secondary | ICD-10-CM

## 2015-09-08 ENCOUNTER — Inpatient Hospital Stay (HOSPITAL_COMMUNITY): Payer: 59

## 2015-09-08 ENCOUNTER — Inpatient Hospital Stay (HOSPITAL_COMMUNITY)
Admission: AD | Admit: 2015-09-08 | Discharge: 2015-09-10 | DRG: 310 | Disposition: A | Discharge status: Home / Self Care | Payer: 59 | Source: Ambulatory Visit | Attending: Cardiovascular Disease | Admitting: Cardiovascular Disease

## 2015-09-08 ENCOUNTER — Encounter: Payer: Self-pay | Admitting: Cardiovascular Disease

## 2015-09-08 ENCOUNTER — Encounter (HOSPITAL_COMMUNITY): Payer: Self-pay | Admitting: *Deleted

## 2015-09-08 ENCOUNTER — Ambulatory Visit (INDEPENDENT_AMBULATORY_CARE_PROVIDER_SITE_OTHER): Payer: 59 | Admitting: Cardiovascular Disease

## 2015-09-08 VITALS — BP 100/94 | HR 164 | Ht 62.0 in | Wt 153.0 lb

## 2015-09-08 DIAGNOSIS — I481 Persistent atrial fibrillation: Principal | ICD-10-CM | POA: Diagnosis present

## 2015-09-08 DIAGNOSIS — E669 Obesity, unspecified: Secondary | ICD-10-CM | POA: Diagnosis present

## 2015-09-08 DIAGNOSIS — F17210 Nicotine dependence, cigarettes, uncomplicated: Secondary | ICD-10-CM | POA: Diagnosis present

## 2015-09-08 DIAGNOSIS — H269 Unspecified cataract: Secondary | ICD-10-CM | POA: Diagnosis present

## 2015-09-08 DIAGNOSIS — F439 Reaction to severe stress, unspecified: Secondary | ICD-10-CM | POA: Diagnosis present

## 2015-09-08 DIAGNOSIS — Z6827 Body mass index (BMI) 27.0-27.9, adult: Secondary | ICD-10-CM | POA: Diagnosis not present

## 2015-09-08 DIAGNOSIS — Z7901 Long term (current) use of anticoagulants: Secondary | ICD-10-CM

## 2015-09-08 DIAGNOSIS — I4891 Unspecified atrial fibrillation: Secondary | ICD-10-CM | POA: Diagnosis present

## 2015-09-08 DIAGNOSIS — E785 Hyperlipidemia, unspecified: Secondary | ICD-10-CM | POA: Diagnosis present

## 2015-09-08 DIAGNOSIS — F419 Anxiety disorder, unspecified: Secondary | ICD-10-CM | POA: Diagnosis present

## 2015-09-08 LAB — CBC WITH DIFFERENTIAL/PLATELET
BASOS ABS: 0 10*3/uL (ref 0.0–0.1)
BASOS PCT: 0 %
Eosinophils Absolute: 0 10*3/uL (ref 0.0–0.7)
Eosinophils Relative: 0 %
HEMATOCRIT: 42.6 % (ref 36.0–46.0)
Hemoglobin: 14.5 g/dL (ref 12.0–15.0)
LYMPHS PCT: 29 %
Lymphs Abs: 2.4 10*3/uL (ref 0.7–4.0)
MCH: 33.5 pg (ref 26.0–34.0)
MCHC: 34 g/dL (ref 30.0–36.0)
MCV: 98.4 fL (ref 78.0–100.0)
Monocytes Absolute: 0.4 10*3/uL (ref 0.1–1.0)
Monocytes Relative: 5 %
NEUTROS ABS: 5.6 10*3/uL (ref 1.7–7.7)
Neutrophils Relative %: 66 %
PLATELETS: 250 10*3/uL (ref 150–400)
RBC: 4.33 MIL/uL (ref 3.87–5.11)
RDW: 14.4 % (ref 11.5–15.5)
WBC: 8.5 10*3/uL (ref 4.0–10.5)

## 2015-09-08 LAB — COMPREHENSIVE METABOLIC PANEL
ALT: 21 U/L (ref 14–54)
ANION GAP: 9 (ref 5–15)
AST: 22 U/L (ref 15–41)
Albumin: 4 g/dL (ref 3.5–5.0)
Alkaline Phosphatase: 82 U/L (ref 38–126)
BUN: 8 mg/dL (ref 6–20)
CHLORIDE: 104 mmol/L (ref 101–111)
CO2: 26 mmol/L (ref 22–32)
Calcium: 9.7 mg/dL (ref 8.9–10.3)
Creatinine, Ser: 0.77 mg/dL (ref 0.44–1.00)
GFR calc non Af Amer: >60 mL/min (ref >60)
Glucose, Bld: 108 mg/dL — ABNORMAL HIGH (ref 65–99)
POTASSIUM: 4.4 mmol/L (ref 3.5–5.1)
SODIUM: 139 mmol/L (ref 135–145)
Total Bilirubin: 0.9 mg/dL (ref 0.3–1.2)
Total Protein: 7 g/dL (ref 6.5–8.1)

## 2015-09-08 LAB — MRSA PCR SCREENING: MRSA by PCR: POSITIVE — AB

## 2015-09-08 LAB — TSH: TSH: 3.456 u[IU]/mL (ref 0.350–4.500)

## 2015-09-08 LAB — MAGNESIUM: Magnesium: 2 mg/dL (ref 1.7–2.4)

## 2015-09-08 MED ORDER — SODIUM CHLORIDE 0.9 % IJ SOLN: 3.0000 mL | INTRAMUSCULAR | Status: DC | PRN

## 2015-09-08 MED ORDER — SODIUM CHLORIDE 0.9 % IJ SOLN
3.0000 mL | Freq: Two times a day (BID) | INTRAMUSCULAR | Status: DC
  Administered 2015-09-08 – 2015-09-10 (×4): 3 mL via INTRAVENOUS

## 2015-09-08 MED ORDER — ACETAMINOPHEN 325 MG PO TABS: 650.0000 mg | ORAL_TABLET | ORAL | Status: DC | PRN

## 2015-09-08 MED ORDER — ALPRAZOLAM 0.25 MG PO TABS
0.2500 mg | ORAL_TABLET | Freq: Two times a day (BID) | ORAL | Status: DC | PRN
  Administered 2015-09-08 – 2015-09-10 (×2): 0.25 mg via ORAL
  Filled 2015-09-08 (×2): qty 1

## 2015-09-08 MED ORDER — DILTIAZEM LOAD VIA INFUSION
10.0000 mg | Freq: Once | INTRAVENOUS | Status: AC
  Administered 2015-09-08: 10 mg via INTRAVENOUS
  Filled 2015-09-08: qty 10

## 2015-09-08 MED ORDER — SODIUM CHLORIDE 0.9 % IV SOLN: 250.0000 mL | INTRAVENOUS | Status: DC | PRN

## 2015-09-08 MED ORDER — OFF THE BEAT BOOK
Freq: Once | Status: AC
  Administered 2015-09-08: 16:00:00
  Filled 2015-09-08: qty 1

## 2015-09-08 MED ORDER — DILTIAZEM HCL 100 MG IV SOLR
5.0000 mg/h | INTRAVENOUS | Status: DC
  Administered 2015-09-08: 10 mg/h via INTRAVENOUS
  Filled 2015-09-08 (×2): qty 100

## 2015-09-08 MED ORDER — ONDANSETRON HCL 4 MG/2ML IJ SOLN
4.0000 mg | Freq: Four times a day (QID) | INTRAMUSCULAR | Status: DC | PRN
Start: 2015-09-08 — End: 2015-09-10

## 2015-09-08 MED ORDER — APIXABAN 5 MG PO TABS
5.0000 mg | ORAL_TABLET | Freq: Two times a day (BID) | ORAL | Status: DC
  Administered 2015-09-08 – 2015-09-10 (×5): 5 mg via ORAL
  Filled 2015-09-08 (×5): qty 1

## 2015-09-08 MED ORDER — NICOTINE 14 MG/24HR TD PT24
14.0000 mg | MEDICATED_PATCH | Freq: Every day | TRANSDERMAL | Status: DC
  Administered 2015-09-08 – 2015-09-10 (×3): 14 mg via TRANSDERMAL
  Filled 2015-09-08 (×3): qty 1

## 2015-09-08 NOTE — Progress Notes (Signed)
Pt arrived to the unit, IV placed. HR in the 170s. IV cardi started, bolus given. Pt very anxious, pt received xanax. Pt states she is very anxious about this situation, and has voiced concerns about her disabled son being taken care of while she is here at the hospital. Will continue to monitor pt. Etta Quill, RN

## 2015-09-08 NOTE — Plan of Care (Signed)
Problem: Education: Goal: Knowledge of disease or condition will improve Outcome: Progressing Pt has been given the "off the beat: book

## 2015-09-08 NOTE — Patient Instructions (Addendum)
Medication Instructions:  No medication changes at this time (going to [facility] hospital for admission)  Labwork: None Ordered   Testing/Procedures: None Ordered   Follow-Up: Your physician recommends that you schedule a follow-up appointment in: 4-6 weeks after hospital discharge - Friday Dec. 9  If you need a refill on your cardiac medications before your next appointment, please call your pharmacy.   Thank you for choosing [facility]! Christen Bame, RN [phone]

## 2015-09-08 NOTE — H&P (Signed)
Cardiology Office Note   Date: 09/08/2015   ID: Wendy Brown, DOB 1956/06/15, MRN [id number]  PCP: REDMON,NOELLE, PA-C Cardiologist: Acie Fredrickson Wonda Cheng, MD   Chief Complaint  Patient presents with  . Atrial Fibrillation   1. Problem list 1. Atrial fibrillation  History of Present Illness: Wendy Brown is a 59 y.o. female who presents for new onset / new diagnosis of atrial fib. Seen with daughter, Wendy Brown .   Was to have cataract surgery this am. Was noticed to have rapid atrial fib.  Was sent to her primary care doctor who called [place] for advice.   She denies any chest pain or shortness of breath. She really cannot tell that her heart rate is regular. She knows that she's had palpitations over the past years    Has seen Dr. Olevia Perches and had atrial fib about 6 years ( found after she fell through the attic floor)  Does not work - takes care of her disabled son.  No CP , no dyspnea. Can have DOE walking up stairs.  Has lots of MSK pain related to her fall in 2010 - sleeps in a recliner     History reviewed. No pertinent past medical history.  History reviewed. No pertinent past surgical history.   No current outpatient prescriptions on file.   No current facility-administered medications for this visit.    Allergies: Review of patient's allergies indicates no known allergies.    Social History: The patient  reports that she has been smoking. She does not have any smokeless tobacco history on file. She reports that she drinks alcohol. She reports that she does not use illicit drugs.   Family History: The patient's family history is not on file. She was adopted.    ROS: Please see the history of present illness.    Review of Systems: Constitutional:  denies fever, chills, diaphoresis, appetite change and fatigue.  HEENT: denies photophobia, eye pain, redness, hearing loss, ear pain, congestion, sore throat,  rhinorrhea, sneezing, neck pain, neck stiffness and tinnitus.  Respiratory: denies SOB, DOE, cough, chest tightness, and wheezing.  Cardiovascular: denies chest pain, palpitations and leg swelling.  Gastrointestinal: denies nausea, vomiting, abdominal pain, diarrhea, constipation, blood in stool.  Genitourinary: denies dysuria, urgency, frequency, hematuria, flank pain and difficulty urinating.  Musculoskeletal: denies myalgias, back pain, joint swelling, arthralgias and gait problem.   Skin: denies pallor, rash and wound.  Neurological: denies dizziness, seizures, syncope, weakness, light-headedness, numbness and headaches.   Hematological: denies adenopathy, easy bruising, personal or family bleeding history.  Psychiatric/ Behavioral: denies suicidal ideation, mood changes, confusion, nervousness, sleep disturbance and agitation.      All other systems are reviewed and negative.    PHYSICAL EXAM: VS: BP 100/94 mmHg  Pulse 164  Ht 5\' 2"  (1.575 m)  Wt 153 lb (69.4 kg)  BMI 27.98 kg/m2 , BMI Body mass index is 27.98 kg/(m^2). GEN: Well nourished, well developed, in no acute distress  HEENT: normal  Neck: no JVD, carotid bruits, or masses Cardiac: Irreg. Irreg. no murmurs, rubs, or gallops,no edema  Respiratory: clear to auscultation bilaterally, normal work of breathing GI: soft, nontender, nondistended, + BS MS: no deformity or atrophy  Skin: warm and dry, no rash Neuro: Strength and sensation are intact Psych: normal   EKG: EKG is ordered today. The ekg ordered today demonstrates atrial fib at 164. NS T wave abn.    Recent Labs: No results found for requested labs within last 365 days.  Lipid Panel  Labs (Brief)    No results found for: CHOL, TRIG, HDL, CHOLHDL, VLDL, LDLCALC, LDLDIRECT     Wt Readings from Last 3 Encounters:  09/08/15 153 lb (69.4 kg)  03/03/09 168 lb (76.204 kg)      Other studies Reviewed: Additional  studies/ records that were reviewed today include: . Review of the above records demonstrates:    ASSESSMENT AND PLAN:  1. Atrial fibrillation with rapid ventricular response.  Her CHADS2VASC score is 1 currently ( female ) ,  Echo has been ordered   She presents today with rapid atrial fibrillation. Her rate is 160. There is really not much that we can do here in the office to correct this because her blood pressure is low. I think that she she should be admitted to the hospital for control and further workup. We will admit her to a stepdown unit. We'll start her on Cardizem drip at 10 mg an hour. We'll check labs including a TSH. We'll get an echocardiogram. Start her on Eliquis 5 mg twice a day. We can consider a TEE cardioversion next week if her heart rate is still rapid.    Current medicines are reviewed at length with the patient today. The patient does not have concerns regarding medicines.  The following changes have been made: no change  Labs/ tests ordered today include:  No orders of the defined types were placed in this encounter.    Disposition: Admit to step down.    Ryenn Howeth, Wonda Cheng, MD  09/08/2015 12:01 PM  [facility], [place], [place] [address] Phone: [phone]; Fax: [phone]

## 2015-09-08 NOTE — Progress Notes (Signed)
Cardiology Office Note   Date:  09/08/2015   ID:  LASHYIA Brown, DOB Feb 26, 1956, MRN [id number]  PCP:  REDMON,NOELLE, PA-C  Cardiologist:   Acie Fredrickson Wonda Cheng, MD   Chief Complaint  Patient presents with  . Atrial Fibrillation   1. Problem list 1. Atrial fibrillation   History of Present Illness: Wendy Brown is a 59 y.o. female who presents for new onset / new diagnosis of atrial fib.  Seen with daughter, Wendy Brown .   Was to have cataract surgery this am.  Was noticed to have rapid atrial fib.  Was sent to her primary care doctor who called [place] for advice.   She denies any chest pain or shortness of breath. She really cannot tell that her heart rate is regular. She knows that she's had palpitations over the past years     Has seen Dr. Olevia Perches and had atrial fib about 6 years ( found after she fell through the attic floor)  Does not work - takes care of her disabled son.  No CP , no dyspnea.   Can have DOE walking up stairs.   Has lots of MSK pain related to her fall in 2010 - sleeps in a recliner     History reviewed. No pertinent past medical history.  History reviewed. No pertinent past surgical history.   No current outpatient prescriptions on file.   No current facility-administered medications for this visit.    Allergies:   Review of patient's allergies indicates no known allergies.    Social History:  The patient  reports that she has been smoking.  She does not have any smokeless tobacco history on file. She reports that she drinks alcohol. She reports that she does not use illicit drugs.   Family History:  The patient's family history is not on file. She was adopted.    ROS:  Please see the history of present illness.    Review of Systems: Constitutional:  denies fever, chills, diaphoresis, appetite change and fatigue.  HEENT: denies photophobia, eye pain, redness, hearing loss, ear pain, congestion, sore throat, rhinorrhea, sneezing, neck pain,  neck stiffness and tinnitus.  Respiratory: denies SOB, DOE, cough, chest tightness, and wheezing.  Cardiovascular: denies chest pain, palpitations and leg swelling.  Gastrointestinal: denies nausea, vomiting, abdominal pain, diarrhea, constipation, blood in stool.  Genitourinary: denies dysuria, urgency, frequency, hematuria, flank pain and difficulty urinating.  Musculoskeletal: denies  myalgias, back pain, joint swelling, arthralgias and gait problem.   Skin: denies pallor, rash and wound.  Neurological: denies dizziness, seizures, syncope, weakness, light-headedness, numbness and headaches.   Hematological: denies adenopathy, easy bruising, personal or family bleeding history.  Psychiatric/ Behavioral: denies suicidal ideation, mood changes, confusion, nervousness, sleep disturbance and agitation.       All other systems are reviewed and negative.    PHYSICAL EXAM: VS:  BP 100/94 mmHg  Pulse 164  Ht 5\' 2"  (1.575 m)  Wt 153 lb (69.4 kg)  BMI 27.98 kg/m2 , BMI Body mass index is 27.98 kg/(m^2). GEN: Well nourished, well developed, in no acute distress HEENT: normal Neck: no JVD, carotid bruits, or masses Cardiac: Irreg. Irreg.  no murmurs, rubs, or gallops,no edema  Respiratory:  clear to auscultation bilaterally, normal work of breathing GI: soft, nontender, nondistended, + BS MS: no deformity or atrophy Skin: warm and dry, no rash Neuro:  Strength and sensation are intact Psych: normal   EKG:  EKG is ordered today. The ekg ordered today demonstrates atrial  fib at 164.  NS T wave abn.     Recent Labs: No results found for requested labs within last 365 days.    Lipid Panel No results found for: CHOL, TRIG, HDL, CHOLHDL, VLDL, LDLCALC, LDLDIRECT    Wt Readings from Last 3 Encounters:  09/08/15 153 lb (69.4 kg)  03/03/09 168 lb (76.204 kg)      Other studies Reviewed: Additional studies/ records that were reviewed today include: . Review of the above records  demonstrates:    ASSESSMENT AND PLAN:  1.  Atrial fibrillation with rapid ventricular response.    CHADS2VASC score of 1 ( female )  She presents today with rapid atrial fibrillation. Her rate is 160. There is really not much that we can do here in the office to correct this because her blood pressure is low. I think that she she should be admitted to the hospital for control and further workup. We will admit her to a stepdown unit. We'll start her on Cardizem drip at 10 mg an hour. We'll check labs including a TSH. We'll get an echocardiogram.  Start her on Eliquis 5 mg twice a day.  We can consider a TEE cardioversion next week if her heart rate is still rapid.    Current medicines are reviewed at length with the patient today.  The patient does not have concerns regarding medicines.  The following changes have been made:  no change  Labs/ tests ordered today include:  No orders of the defined types were placed in this encounter.     Disposition:   Admit to step down.   I will see her in 4-6     [name], Wonda Cheng, MD  09/08/2015 12:01 PM    [facility] [address], [place], [place]  [address] Phone: [phone]; Fax: [phone]   [facility]  [address] [place],   [address] [phone]   Fax [phone]

## 2015-09-08 NOTE — Progress Notes (Signed)
Utilization review completed. Najiyah Paris, RN, BSN. 

## 2015-09-09 ENCOUNTER — Inpatient Hospital Stay (HOSPITAL_COMMUNITY): Payer: 59

## 2015-09-09 DIAGNOSIS — I4891 Unspecified atrial fibrillation: Secondary | ICD-10-CM

## 2015-09-09 LAB — BASIC METABOLIC PANEL
ANION GAP: 6 (ref 5–15)
BUN: 7 mg/dL (ref 6–20)
CALCIUM: 9.2 mg/dL (ref 8.9–10.3)
CO2: 30 mmol/L (ref 22–32)
Chloride: 103 mmol/L (ref 101–111)
Creatinine, Ser: 0.78 mg/dL (ref 0.44–1.00)
GFR calc Af Amer: >60 mL/min (ref >60)
GLUCOSE: 99 mg/dL (ref 65–99)
POTASSIUM: 4.1 mmol/L (ref 3.5–5.1)
SODIUM: 139 mmol/L (ref 135–145)

## 2015-09-09 LAB — LIPID PANEL
CHOL/HDL RATIO: 5.2 ratio
CHOLESTEROL: 194 mg/dL (ref 0–200)
HDL: 37 mg/dL — AB (ref >40)
LDL Cholesterol: 145 mg/dL — ABNORMAL HIGH (ref 0–99)
Triglycerides: 58 mg/dL (ref <150)
VLDL: 12 mg/dL (ref 0–40)

## 2015-09-09 MED ORDER — CHLORHEXIDINE GLUCONATE CLOTH 2 % EX PADS
6.0000 | MEDICATED_PAD | Freq: Every day | CUTANEOUS | Status: DC
  Administered 2015-09-10: 6 via TOPICAL

## 2015-09-09 MED ORDER — AMIODARONE HCL 200 MG PO TABS
400.0000 mg | ORAL_TABLET | Freq: Two times a day (BID) | ORAL | Status: DC
  Administered 2015-09-09 – 2015-09-10 (×3): 400 mg via ORAL
  Filled 2015-09-09 (×3): qty 2

## 2015-09-09 MED ORDER — MUPIROCIN 2 % EX OINT
1.0000 "application " | TOPICAL_OINTMENT | Freq: Two times a day (BID) | CUTANEOUS | Status: DC
  Administered 2015-09-09 – 2015-09-10 (×3): 1 via NASAL
  Filled 2015-09-09: qty 22

## 2015-09-09 NOTE — Care Management Note (Signed)
Case Management Note Marvetta Gibbons RN, BSN Unit 2W-Case Manager [phone]  Patient Details  Name: DARISHA OSTERMEYER MRN: [id number] Date of Birth: 1956/08/19  Subjective/Objective:  Pt admitted with afib                   Action/Plan: PTA pt lived at home- has been started on Eliquis- insurance check completed--S/W JESSICA @ [employer] # [phone]  ELIQUIS 5 MG BID  COVER- YES  CO-PAY- $ 160.00  [place]: [employer]  ------- spoke with pt at bedside- insurance coverage discussed- pt reports that her insurance provider will change on 10/23/15 to [facility]- encouraged pt to check with [employer] to see what her copay will be come Jan. 1- pt given both 30 day free card to use along with $10 copay cards to use to reduce the cost of Eliquis for the first 12 mo. -- Pharmacy has been in also to educate pt on Eliquis- pt with no further questions regarding Eliquis at this time.   Expected Discharge Date:     09/10/15             Expected Discharge Plan:  Home/Self Care  In-House Referral:     Discharge planning Services  CM Consult, Medication Assistance  Post Acute Care Choice:    Choice offered to:     DME Arranged:    DME Agency:     HH Arranged:    HH Agency:     Status of Service:  In process, will continue to follow  Medicare Important Message Given:    Date Medicare IM Given:    Medicare IM give by:    Date Additional Medicare IM Given:    Additional Medicare Important Message give by:     If discussed at [place] of Stay Meetings, dates discussed:    Additional Comments:  Dawayne Patricia, RN 09/09/2015, 9:51 AM

## 2015-09-09 NOTE — Progress Notes (Addendum)
Patient Name: Wendy Brown Date of Encounter: 09/09/2015     Active Problems:   Atrial fibrillation ([place])    SUBJECTIVE  Patient very anxious- we talked for over 10 minutes abuot all the stressor in her life and about how atrial fibrillation is very treatable and not her fault. She currently has no CP or SOB or dizziness. Feeling okay. Very anxious about her "heart condition"  CURRENT MEDS . apixaban  5 mg Oral BID  . nicotine  14 mg Transdermal Daily  . sodium chloride  3 mL Intravenous Q12H    OBJECTIVE  Filed Vitals:   09/09/15 0026 09/09/15 0045 09/09/15 0543 09/09/15 0659  BP:  93/68    Pulse:      Temp: 98.1 F (36.7 C)  98.5 F (36.9 C) 98.3 F (36.8 C)  TempSrc: Oral  Oral Oral  Resp:  15    Height:      Weight:   150 lb 8 oz (68.266 kg)   SpO2:        Intake/Output Summary (Last 24 hours) at 09/09/15 0949 Last data filed at 09/09/15 0500  Gross per 24 hour  Intake 390.17 ml  Output      0 ml  Net 390.17 ml   Filed Weights   09/08/15 1410 09/09/15 0543  Weight: 150 lb 11.2 oz (68.357 kg) 150 lb 8 oz (68.266 kg)    PHYSICAL EXAM  General: Pleasant, NAD. Neuro: Alert and oriented X 3. Moves all extremities spontaneously. Psych: Normal affect. HEENT:  Normal  Neck: Supple without bruits or JVD. Lungs:  Resp regular and unlabored, CTA. Heart: irreg irreg no s3, s4, or murmurs. Abdomen: Soft, non-tender, non-distended, BS + x 4.  Extremities: No clubbing, cyanosis or edema. DP/PT/Radials 2+ and equal bilaterally.  Accessory Clinical Findings  CBC  Recent Labs  09/08/15 1445  WBC 8.5  NEUTROABS 5.6  HGB 14.5  HCT 42.6  MCV 98.4  PLT [id number]   Basic Metabolic Panel  Recent Labs  09/08/15 1445 09/09/15 0552  NA 139 139  K 4.4 4.1  CL 104 103  CO2 26 30  GLUCOSE 108* 99  BUN 8 7  CREATININE 0.77 0.78  CALCIUM 9.7 9.2  MG 2.0  --    Liver Function Tests  Recent Labs  09/08/15 1445  AST 22  ALT 21  ALKPHOS 82  BILITOT  0.9  PROT 7.0  ALBUMIN 4.0    Fasting Lipid Panel  Recent Labs  09/09/15 0552  CHOL 194  HDL 37*  LDLCALC 145*  TRIG 58  CHOLHDL 5.2   Thyroid Function Tests  Recent Labs  09/08/15 1445  TSH 3.456    TELE  afib with CVR HR in 90s  Radiology/Studies  Dg Chest Port 1 View  09/08/2015  CLINICAL DATA:  Tachycardia and abnormal EKG before having cataract surgery this morning. EXAM: PORTABLE CHEST 1 VIEW COMPARISON:  03/21/2010 FINDINGS: The cardiac silhouette, mediastinal and hilar contours are within normal limits and stable. The lungs are clear. No pleural effusion. Remote thoracic trauma with numerous rib fractures and mini plate and screw fixation on the right. IMPRESSION: No acute cardiopulmonary findings. Electronically Signed   By: Marijo Sanes M.D.   On: 09/08/2015 15:38    ASSESSMENT AND PLAN   Wendy Brown is a 59 y.o. female with a history of PAF ( one episode occurred 1x after accident ~ 2010), anxiety and tobacco abuse who was admitted from the office yesterday (09/08/15)  with new onset atrial fibrillation.   Atrial fibrillation with RVR- HR better controlled on IV diltiazem 5mg  but BPs remain low -- CHADSVASC score of 1 (female sex). Started on Eliquis 5mg  BID.  -- TSH normal  -- 2D ECHO to be done today.   -- She seems to be asymptomatic, but BPs remain low. Will start amiodarone 400mg  BID x1 week and then tapered down w/ plans for DCCV in 3-4 on Eliquis if she remains in atrial fibrillation.   -- Will plan to keep her today with discharge home tomorrow morning. I have arranged for follow up in the afib clinic next Tuesday.  Anxiety- patient is very anxious due to having to take care of her disabled son.   HLD- LDL 145.   Judy Pimple PA-C  Pager [phone]  I have personally seen and examined this patient with Nell Range, PA-C. I agree with the assessment and plan as outlined above. She is admitted with atrial fib, new onset.  She is now on Eliquis. Rate controlled on IV Cardizem but BP is soft. Will change to po amiodarone today. Echo this am. If she is rate controlled with stable BP, plan d/c home later today or tomorrow with f/u next week in atrial fib clinic.   MCALHANY,CHRISTOPHER 09/09/2015 10:14 AM

## 2015-09-09 NOTE — Progress Notes (Signed)
Echocardiogram 2D Echocardiogram has been performed.  Wendy Brown 09/09/2015, 10:56 AM

## 2015-09-09 NOTE — Discharge Instructions (Signed)
Information on my medicine - ELIQUIS (apixaban)  This medication education was reviewed with me or my healthcare representative as part of my discharge preparation.  The pharmacist that spoke with me during my hospital stay was:  Koal Eslinger, RPH  Why was Eliquis prescribed for you? Eliquis was prescribed for you to reduce the risk of forming blood clots that can cause a stroke if you have a medical condition called atrial fibrillation (a type of irregular heartbeat) OR to reduce the risk of a blood clots forming after orthopedic surgery.  What do You need to know about Eliquis ? Take your Eliquis TWICE DAILY - one tablet in the morning and one tablet in the evening with or without food.  It would be best to take the doses about the same time each day.  If you have difficulty swallowing the tablet whole please discuss with your pharmacist how to take the medication safely.  Take Eliquis exactly as prescribed by your doctor and DO NOT stop taking Eliquis without talking to the doctor who prescribed the medication.  Stopping may increase your risk of developing a new clot or stroke.  Refill your prescription before you run out.  After discharge, you should have regular check-up appointments with your healthcare provider that is prescribing your Eliquis.  In the future your dose may need to be changed if your kidney function or weight changes by a significant amount or as you get older.  What do you do if you miss a dose? If you miss a dose, take it as soon as you remember on the same day and resume taking twice daily.  Do not take more than one dose of ELIQUIS at the same time.  Important Safety Information A possible side effect of Eliquis is bleeding. You should call your healthcare provider right away if you experience any of the following: ? Bleeding from an injury or your nose that does not stop. ? Unusual colored urine (red or dark brown) or unusual colored stools (red or  black). ? Unusual bruising for unknown reasons. ? A serious fall or if you hit your head (even if there is no bleeding).  Some medicines may interact with Eliquis and might increase your risk of bleeding or clotting while on Eliquis. To help avoid this, consult your healthcare provider or pharmacist prior to using any new prescription or non-prescription medications, including herbals, vitamins, non-steroidal anti-inflammatory drugs (NSAIDs) and supplements.  This website has more information on Eliquis (apixaban): [email or web address].no.

## 2015-09-10 LAB — HEMOGLOBIN A1C
Hgb A1c MFr Bld: 5.8 % — ABNORMAL HIGH (ref 4.8–5.6)
MEAN PLASMA GLUCOSE: 120 mg/dL

## 2015-09-10 MED ORDER — APIXABAN 5 MG PO TABS: 5.0000 mg | ORAL_TABLET | Freq: Two times a day (BID) | ORAL | Status: DC

## 2015-09-10 MED ORDER — METOPROLOL SUCCINATE ER 25 MG PO TB24: 25.0000 mg | ORAL_TABLET | Freq: Every day | ORAL | Status: DC

## 2015-09-10 MED ORDER — NICOTINE 14 MG/24HR TD PT24: 14.0000 mg | MEDICATED_PATCH | Freq: Every day | TRANSDERMAL | Status: DC

## 2015-09-10 MED ORDER — AMIODARONE HCL 200 MG PO TABS: 400.0000 mg | ORAL_TABLET | Freq: Two times a day (BID) | ORAL | Status: DC

## 2015-09-10 NOTE — Discharge Summary (Signed)
CARDIOLOGY DISCHARGE SUMMARY   Patient ID: Wendy Brown MRN: [id number] DOB/AGE: [birth year] 59 y.o.  Admit date: 09/08/2015 Discharge date: 09/10/2015  PCP: REDMON,NOELLE, PA-C Primary Cardiologist: Dr. Acie Fredrickson  Primary Discharge Diagnosis:  Atrial fibrillation Secondary Discharge Diagnosis:  Cataracts Tobacco use Chronic anticoagulation, Eliquis, chads 2 vasc=1  Procedures: 2-D echocardiogram  Hospital Course: Wendy Brown is a 59 y.o. female with a history of atrial fibrillation about 6 years ago, cataracts and ongoing tobacco use. She went to the doctor for cataract surgery and was found to be in atrial fibrillation. She was seen by Dr. Acie Fredrickson and admitted for further evaluation and treatment.  An echocardiogram was performed, results are below. Her EF is preserved with no wall motion abnormalities and her atria are only mildly dilated.   She was initially started on Cardizem for rate control but her blood pressure did not tolerate this above 5 mg per hour. She was started on amiodarone 400 mg twice a day. Her heart rate improved on the medications. Since she did not tolerate the Cardizem well with a very low blood pressure, she was changed to total long at a low dose and hopefully she will tolerate this well.  On 11/19, she was seen by Dr. Wynonia Lawman and all data were reviewed. She was having significant problems with anxiety and requested Xanax. It is appropriate for her to treat her anxiety, and she will obtain this through her family physician. It will not interfere with the amiodarone, the Eliquis or the metoprolol.  She is interested in smoking cessation and was started on a nicotine patch. We will continue this at discharge.  On the medications, her heart rate was controlled. She takes care of of a disabled son and does not wish to have a prolonged hospitalization because this. Therefore, we will pursue rate control for now and consider cardioversion in 3-4 weeks.  She has an early follow-up in the A. fib clinic already arranged. No further inpatient workup is indicated and she is considered stable for discharge, to follow up as an outpatient.  Labs:   Lab Results  Component Value Date   WBC 8.5 09/08/2015   HGB 14.5 09/08/2015   HCT 42.6 09/08/2015   MCV 98.4 09/08/2015   PLT 250 09/08/2015     Recent Labs Lab 09/08/15 1445 09/09/15 0552  NA 139 139  K 4.4 4.1  CL 104 103  CO2 26 30  BUN 8 7  CREATININE 0.77 0.78  CALCIUM 9.7 9.2  PROT 7.0  --   BILITOT 0.9  --   ALKPHOS 82  --   ALT 21  --   AST 22  --   GLUCOSE 108* 99   Lipid Panel     Component Value Date/Time   CHOL 194 09/09/2015 0552   TRIG 58 09/09/2015 0552   HDL 37* 09/09/2015 0552   CHOLHDL 5.2 09/09/2015 0552   VLDL 12 09/09/2015 0552   LDLCALC 145* 09/09/2015 0552     Radiology: Dg Chest Port 1 View 09/08/2015  CLINICAL DATA:  Tachycardia and abnormal EKG before having cataract surgery this morning. EXAM: PORTABLE CHEST 1 VIEW COMPARISON:  03/21/2010 FINDINGS: The cardiac silhouette, mediastinal and hilar contours are within normal limits and stable. The lungs are clear. No pleural effusion. Remote thoracic trauma with numerous rib fractures and mini plate and screw fixation on the right. IMPRESSION: No acute cardiopulmonary findings. Electronically Signed   By: Marijo Sanes M.D.   On: 09/08/2015  15:38   EKG: 09/08/2015 (In the office) Atrial fibrillation with rapid ventricular response Heart rate 164  Echo: 09/09/2015 Conclusions - Left ventricle: The cavity size was normal. Wall thickness was normal. Systolic function was normal. The estimated ejection fraction was in the range of 50% to 55%. - Mitral valve: Mild prolapse of the anterior mitral leaflet There was mild regurgitation. - Left atrium: The atrium was mildly dilated.  FOLLOW UP PLANS AND APPOINTMENTS Allergies  Allergen Reactions  . Latex Rash     Medication List    TAKE these  medications        amiodarone 200 MG tablet  Commonly known as:  PACERONE  Take 2 tablets (400 mg total) by mouth 2 (two) times daily. 400 mg BID x week, then 400 mg daily.     apixaban 5 MG Tabs tablet  Commonly known as:  ELIQUIS  Take 1 tablet (5 mg total) by mouth 2 (two) times daily.     ketorolac 0.4 % Soln  Commonly known as:  ACULAR  Place 1 drop into the right eye 4 (four) times daily.     metoprolol succinate 25 MG 24 hr tablet  Commonly known as:  TOPROL XL  Take 1 tablet (25 mg total) by mouth daily.     ofloxacin 0.3 % ophthalmic solution  Commonly known as:  OCUFLOX  Place 1 drop into the right eye 4 (four) times daily.     XIIDRA 5 % Soln  Generic drug:  Lifitegrast  Place 1 drop into both eyes 2 (two) times daily.        Discharge Instructions    Diet - low sodium heart healthy    Complete by:  As directed      Increase activity slowly    Complete by:  As directed           Follow-up Information    Follow up with CARROLL,DONNA, NP On 09/13/2015.   Specialties:  Nurse Practitioner, Cardiology   Why:  @ 1:30pm   Contact information:   [place] [address] [phone]       BRING ALL MEDICATIONS WITH YOU TO FOLLOW UP APPOINTMENTS  Time spent with patient to include physician time: 38 min Signed: Rosaria Ferries, PA-C 09/10/2015, 12:13 PM Co-Sign MD

## 2015-09-10 NOTE — Progress Notes (Addendum)
Subjective:  Atrial fibrillation rate somewhat better controlled.  Rate around 90-100 rest now.  Objective:  Vital Signs in the last 24 hours: BP 106/80 mmHg  Pulse 77  Temp(Src) 97.7 F (36.5 C) (Oral)  Resp 18  Ht 5\' 2"  (1.575 m)  Wt 67.858 kg (149 lb 9.6 oz)  BMI 27.36 kg/m2  SpO2 96%  Physical Exam: Anxious obese female in no acute distress Lungs:  Clear Cardiac:  Irregular rhythm, normal S1 and S2, no S3 Extremities:  No edema present  Intake/Output from previous day: 11/18 0701 - 11/19 0700 In: 486 [P.O.:480; I.V.:6] Out: 2600 [Urine:2600]  Weight Filed Weights   09/08/15 1410 09/09/15 0543 09/10/15 0522  Weight: 68.357 kg (150 lb 11.2 oz) 68.266 kg (150 lb 8 oz) 67.858 kg (149 lb 9.6 oz)    Lab Results: Basic Metabolic Panel:  Recent Labs  09/08/15 1445 09/09/15 0552  NA 139 139  K 4.4 4.1  CL 104 103  CO2 26 30  GLUCOSE 108* 99  BUN 8 7  CREATININE 0.77 0.78   CBC:  Recent Labs  09/08/15 1445  WBC 8.5  NEUTROABS 5.6  HGB 14.5  HCT 42.6  MCV 98.4  PLT 250   Telemetry: Currently atrial fibrillation with controlled response when at rest.  When I anxious or walking sometimes rate will tend to go up   Assessment/Plan:  1.  Persistent atrial fibrillation but rate controlled now 2.  Long-term anticoagulation 3.  Obesity 4.  Severe anxiety and situational stress  Recommendations:  Greater than 30 minutes spent with patient in room as well as family.  Numerous questions answered and atrial fibrillation discussed.  We'll discharge today on amiodarone 400 mg twice daily for another week.  Follow-up arranged for atrial fibrillation clinic on Tuesday.  We'll add metoprolol extended release 25 mg daily because of blood pressure to help with rate control when she is up.  Continue Eliquis.  Cardioversion in 3-4 weeks.  She called me in the hall later and was asking about Xanax.  I recommended that she get this from her primary doctor but may need to  get a few tablets to hold her over until Monday.  Recommended 0.25 mg.     Kerry Hough  MD [facility]  09/10/2015, 10:02 AM

## 2015-09-13 ENCOUNTER — Encounter (HOSPITAL_COMMUNITY): Payer: Self-pay | Admitting: Nurse Practitioner

## 2015-09-13 ENCOUNTER — Ambulatory Visit (HOSPITAL_COMMUNITY)
Admission: RE | Admit: 2015-09-13 | Discharge: 2015-09-13 | Disposition: A | Discharge status: Home / Self Care | Payer: 59 | Source: Ambulatory Visit | Attending: Nurse Practitioner | Admitting: Nurse Practitioner

## 2015-09-13 VITALS — BP 106/72 | HR 55 | Ht 61.0 in | Wt 152.2 lb

## 2015-09-13 DIAGNOSIS — I4891 Unspecified atrial fibrillation: Secondary | ICD-10-CM | POA: Insufficient documentation

## 2015-09-13 MED ORDER — AMIODARONE HCL 200 MG PO TABS: 400.0000 mg | ORAL_TABLET | Freq: Two times a day (BID) | ORAL | Status: DC

## 2015-09-13 NOTE — Progress Notes (Signed)
Patient ID: Wendy Brown, female   DOB: 04/08/1956, 59 y.o.   MRN: [id number]     Primary Care Physician: Cleda Mccreedy Referring Physician: Zacarias Pontes f/u Cardiologist: Dr. Alcario Drought is a 59 y.o. female with a h/o  atrial fibrillation about 6 years ago, cataracts and ongoing tobacco use. She went to the doctor for cataract surgery and was found to be in atrial fibrillation with rvr. She was seen by Dr. Acie Fredrickson and admitted for further evaluation and treatment.  An echocardiogram was performed. Her EF is preserved with no wall motion abnormalities and her atria are only mildly dilated.   She was initially started on Cardizem for rate control but her blood pressure did not tolerate this above 5 mg per hour. She was started on amiodarone 400 mg twice a day. Her heart rate improved on the medications. Since she did not tolerate the Cardizem well with a very low blood pressure, she was changed to metoprolol at a low dose and hopefully she will tolerate this well.  On 11/19, she was seen by Dr. Wynonia Lawman and all data were reviewed. She was having significant problems with anxiety and requested Xanax. It is appropriate for her to treat her anxiety, and she will obtain this through her family physician. It will not interfere with the amiodarone, the Eliquis or the metoprolol.  She was interested in smoking cessation and  started on a nicotine patch.   Today, in the afib clinic, she states that she started feeling better yesterday and EKG shows SR at 53 bpm. She continues on the patch and is no longer smoking. She has eliminated most of her caffeine. She denies apnea episodes but does snore. No alcohol. Does admit to high stress level. 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 Still hopes to have her cataract surgery in late December, after seen and cleared by Dr. Cathie Olden, 12/9.  Today, she denies symptoms of palpitations, chest pain, shortness of breath, orthopnea, PND, lower  extremity edema, dizziness, presyncope, syncope, or neurologic sequela. Positive for stress/anxiety.The patient is tolerating medications without difficulties and is otherwise without complaint today.   No past medical history on file. No past surgical history on file.  Current Outpatient Prescriptions  Medication Sig Dispense Refill  . amiodarone (PACERONE) 200 MG tablet Take 2 tablets (400 mg total) by mouth 2 (two) times daily. 200 mg BID x week, then 200 mg daily. 88 tablet 3  . apixaban (ELIQUIS) 5 MG TABS tablet Take 1 tablet (5 mg total) by mouth 2 (two) times daily. 60 tablet 11  . metoprolol succinate (TOPROL XL) 25 MG 24 hr tablet Take 1 tablet (25 mg total) by mouth daily. 30 tablet 11  . nicotine (NICODERM CQ - DOSED IN MG/24 HOURS) 14 mg/24hr patch Place 1 patch (14 mg total) onto the skin daily. 28 patch 0  . XIIDRA 5 % SOLN Place 1 drop into both eyes 2 (two) times daily.  12  . ketorolac (ACULAR) 0.4 % SOLN Place 1 drop into the right eye 4 (four) times daily.    Marland Kitchen ofloxacin (OCUFLOX) 0.3 % ophthalmic solution Place 1 drop into the right eye 4 (four) times daily.     No current facility-administered medications for this encounter.    Allergies  Allergen Reactions  . Latex Rash    Social History   Social History  . Marital Status: Single    Spouse Name: N/A  . Number of Children: N/A  .  Years of Education: N/A   Occupational History  . Not on file.   Social History Main Topics  . Smoking status: Current Some Day Smoker -- 1.50 packs/day    Types: Cigarettes  . Smokeless tobacco: Not on file  . Alcohol Use: 0.0 oz/week    0 Standard drinks or equivalent per week     Comment: OCCASSIONALLY  . Drug Use: No  . Sexual Activity: Not on file   Other Topics Concern  . Not on file   Social History Narrative    Family History  Problem Relation Age of Onset  . Adopted: Yes    ROS- All systems are reviewed and negative except as per the HPI above  Physical  Exam: Filed Vitals:   09/13/15 1327  BP: 106/72  Pulse: 55  Height: 5\' 1"  (1.549 m)  Weight: 152 lb 3.2 oz (69.037 kg)    GEN- The patient is well appearing, alert and oriented x 3 today.   Head- normocephalic, atraumatic Eyes-  Sclera clear, conjunctiva pink Ears- hearing intact Oropharynx- clear Neck- supple, no JVP Lymph- no cervical lymphadenopathy Lungs- Clear to ausculation bilaterally, normal work of breathing Heart- Regular rate and rhythm, no murmurs, rubs or gallops, PMI not laterally displaced GI- soft, NT, ND, + BS Extremities- no clubbing, cyanosis, or edema MS- no significant deformity or atrophy Skin- no rash or lesion Psych- euthymic mood, full affect Neuro- strength and sensation are intact  EKG-Sinus bradycardia, v rate 55 bpm, pr int 160 ms,  qrs int 80 ms, qtc 336 ms Epic records reviewed  Labs:  TSH, Mag, Liver panel, renal function, cbc normal  Echo:Left ventricle: The cavity size was normal. Wall thickness was normal. Systolic function was normal. The estimated ejection fraction was in the range of 50% to 55%. - Mitral valve: Mild prolapse of the anterior mitral leaflet There was mild regurgitation. - Left atrium: The atrium was mildly dilated.  Assessment and Plan: 1. Afib with rvr In SR on amiodarone load. Continue 400 mg bid until 11/26, then 200 mg bid until 12/3, then 200 mg a day. Continue metoprolol. Continue apixaban for chadsvasc score of 1(female)  2. Smoking cessation Continue patch  F/u with Dr. Cathie Olden 12/9 Afib clinc as needed

## 2015-09-13 NOTE — Patient Instructions (Signed)
Your physician has recommended you make the following change in your medication:  1)Decrease Amiodarone to 200mg  twice daily on Saturday (09/17/15) for 1 week then decrease amiodarone to 200mg  once daily (09/24/2015)

## 2015-09-30 ENCOUNTER — Encounter: Payer: Self-pay | Admitting: Cardiovascular Disease

## 2015-09-30 ENCOUNTER — Ambulatory Visit (INDEPENDENT_AMBULATORY_CARE_PROVIDER_SITE_OTHER): Payer: 59 | Admitting: Cardiovascular Disease

## 2015-09-30 VITALS — BP 132/82 | HR 49 | Ht 61.0 in | Wt 151.4 lb

## 2015-09-30 DIAGNOSIS — E785 Hyperlipidemia, unspecified: Secondary | ICD-10-CM

## 2015-09-30 MED ORDER — AMIODARONE HCL 200 MG PO TABS
200.0000 mg | ORAL_TABLET | Freq: Every day | ORAL | Status: DC
Start: 2015-09-30 — End: 2016-03-28
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
## 2015-09-30 NOTE — Patient Instructions (Signed)
Medication Instructions:  DECREASE Amiodarone to 200 mg once daily   Labwork: Your physician recommends that you return for lab work in: 6 months on the day of or a few days before your office visit with Dr. Acie Fredrickson.  You will need to FAST for this appointment - nothing to eat or drink after midnight the night before except water.   Testing/Procedures: None Ordered   Follow-Up: Your physician wants you to follow-up in: 6 months with Dr. Acie Fredrickson.  You will receive a reminder letter in the mail two months in advance. If you don't receive a letter, please call our office to schedule the follow-up appointment.  If you need a refill on your cardiac medications before your next appointment, please call your pharmacy.   Thank you for choosing [facility]! Christen Bame, RN [phone]

## 2015-10-03 NOTE — Addendum Note (Signed)
Addended by: Freada Bergeron on: 10/03/2015 03:46 PM   Modules accepted: Orders

## 2015-11-18 ENCOUNTER — Telehealth: Payer: Self-pay | Admitting: Cardiovascular Disease
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
## 2015-11-18 NOTE — Telephone Encounter (Signed)
Wendy Brown  is calling because she is feeling like she is jumpy , and her pulse rate is up , feeling anxious . Thinking she is going back into AFIB   Thanks

## 2015-11-22 NOTE — Telephone Encounter (Signed)
I agree with note by Michelle Swinyer, RN    

## 2015-12-06 ENCOUNTER — Telehealth: Payer: Self-pay | Admitting: Cardiovascular Disease

## 2015-12-06 NOTE — Telephone Encounter (Signed)
New message     Patient calling what she can take with the medication she is on.  i.e. stool softener

## 2015-12-06 NOTE — Telephone Encounter (Signed)
Spoke with patient who asked if a stool softener is safe to take with her other medications.  I advised her that she can take a stool softener. She states she would like to talk to Dr. Acie Fredrickson at next office visit about extremity and facial swelling that she generally experiences in the summer.  I advised her she will receive a letter in the next couple of months to schedule her June appointment.  She verbalized understanding and agreement.

## 2015-12-16 ENCOUNTER — Telehealth: Payer: Self-pay | Admitting: Cardiovascular Disease

## 2015-12-16 NOTE — Telephone Encounter (Signed)
Pt wants to know what can she take for her sinus?

## 2015-12-16 NOTE — Telephone Encounter (Signed)
New message      Pt states that she feels "wierd".  She can feel her heartbeat in her head.  Bp is 112/69 HR 66.  She has been feeling this way for a couple of days.  Only change is that she has begun taking a stool softner.  She states she is very stressed also

## 2015-12-16 NOTE — Telephone Encounter (Signed)
Spoke with patient and advised her to avoid decongestants.  She asks if she can use flonase nasal spray and/or allegra, claritin, or zyrtec.  I advised that she may take these as long as they do not have a decongestant agent.  I advised her to avoid sudafed brand name or generic.  She verbalized understanding and thanked me for the call.
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
## 2016-03-12 DIAGNOSIS — Z1231 Encounter for screening mammogram for malignant neoplasm of breast: Secondary | ICD-10-CM | POA: Diagnosis not present

## 2016-03-26 ENCOUNTER — Other Ambulatory Visit (INDEPENDENT_AMBULATORY_CARE_PROVIDER_SITE_OTHER): Payer: BLUE CROSS/BLUE SHIELD | Admitting: *Deleted

## 2016-03-26 DIAGNOSIS — E785 Hyperlipidemia, unspecified: Secondary | ICD-10-CM | POA: Diagnosis not present

## 2016-03-26 LAB — COMPREHENSIVE METABOLIC PANEL
ALBUMIN: 4 g/dL (ref 3.6–5.1)
ALT: 17 U/L (ref 6–29)
AST: 16 U/L (ref 10–35)
Alkaline Phosphatase: 59 U/L (ref 33–130)
BILIRUBIN TOTAL: 0.4 mg/dL (ref 0.2–1.2)
BUN: 12 mg/dL (ref 7–25)
CO2: 27 mmol/L (ref 20–31)
CREATININE: 0.72 mg/dL (ref 0.50–0.99)
Calcium: 9.3 mg/dL (ref 8.6–10.4)
Chloride: 104 mmol/L (ref 98–110)
Glucose, Bld: 107 mg/dL — ABNORMAL HIGH (ref 65–99)
Potassium: 4.2 mmol/L (ref 3.5–5.3)
SODIUM: 139 mmol/L (ref 135–146)
TOTAL PROTEIN: 6.4 g/dL (ref 6.1–8.1)

## 2016-03-26 LAB — LIPID PANEL
Cholesterol: 242 mg/dL — ABNORMAL HIGH (ref 125–200)
HDL: 59 mg/dL (ref >=46)
LDL CALC: 169 mg/dL — AB (ref <130)
Total CHOL/HDL Ratio: 4.1 Ratio (ref <=5.0)
Triglycerides: 70 mg/dL (ref <150)
VLDL: 14 mg/dL (ref <30)

## 2016-03-26 NOTE — Addendum Note (Signed)
Addended by: Eulis Foster on: 03/26/2016 08:38 AM   Modules accepted: Orders

## 2016-03-28 ENCOUNTER — Encounter: Payer: Self-pay | Admitting: Cardiovascular Disease

## 2016-03-28 ENCOUNTER — Ambulatory Visit (INDEPENDENT_AMBULATORY_CARE_PROVIDER_SITE_OTHER): Payer: BLUE CROSS/BLUE SHIELD | Admitting: Cardiovascular Disease

## 2016-03-28 ENCOUNTER — Other Ambulatory Visit: Payer: Self-pay

## 2016-03-28 VITALS — BP 96/70 | HR 57 | Ht 61.0 in | Wt 151.4 lb

## 2016-03-28 DIAGNOSIS — I480 Paroxysmal atrial fibrillation: Secondary | ICD-10-CM | POA: Diagnosis not present

## 2016-03-28 DIAGNOSIS — E785 Hyperlipidemia, unspecified: Secondary | ICD-10-CM | POA: Diagnosis not present

## 2016-03-28 MED ORDER — ATORVASTATIN CALCIUM 20 MG PO TABS: 20.0000 mg | ORAL_TABLET | Freq: Every day | ORAL | Status: DC

## 2016-03-28 NOTE — Progress Notes (Signed)
Cardiology Office Note   Date:  03/28/2016   ID:  Wendy Brown, DOB April 29, 1956, MRN [id number]  PCP:  REDMON,NOELLE, PA-C  Cardiologist:   Mertie Moores, MD   Chief Complaint  Patient presents with  . Follow-up   1. Problem list 1. Atrial fibrillation   History of Present Illness: Wendy Brown is a 60 y.o. female who presents for new onset / new diagnosis of atrial fib.  Seen with daughter, [name] .   Was to have cataract surgery this am.  Was noticed to have rapid atrial fib.  Was sent to her primary care doctor who called [place] for advice.   She denies any chest pain or shortness of breath. She really cannot tell that her heart rate is regular. She knows that she's had palpitations over the past years   Has seen Dr. Olevia Perches and had atrial fib about 6 years ( found after she fell through the attic floor)  Does not work - takes care of her disabled son.  No CP , no dyspnea.   Can have DOE walking up stairs.   Has lots of MSK pain related to her fall in 2010 - sleeps in a recliner   Dec. 9, 2016:  [name] was admitted to the hospital with rapid atrial fib.  Was started on amiodarone. Has converted to NSR ,  OnEliuqis.  Feeling better.  Has cut her coffee intake.  Eating lot fat meat.   Doing well.  No additional episodes of Afib.   Getting some exercise.    Was a bit fatigued immediately after she left the hospital because of all the new medications. Has been using Vaping cigarettes.    March 28, 2016:  Doing well. Still in NSR , on amio 200 a day    No past medical history on file.  No past surgical history on file.   Current Outpatient Prescriptions  Medication Sig Dispense Refill  . ALPRAZolam (XANAX) 0.25 MG tablet TK 1 T PO  ONCE D PRN  0  . amiodarone (PACERONE) 200 MG tablet Take 1 tablet (200 mg total) by mouth daily. 200 mg BID x week, then 200 mg daily. 31 tablet 11  . apixaban (ELIQUIS) 5 MG TABS tablet Take 1 tablet (5 mg total) by mouth 2 (two)  times daily. 60 tablet 11  . metoprolol succinate (TOPROL XL) 25 MG 24 hr tablet Take 1 tablet (25 mg total) by mouth daily. 30 tablet 11  . nicotine (NICODERM CQ - DOSED IN MG/24 HOURS) 14 mg/24hr patch Place 1 patch (14 mg total) onto the skin daily. 28 patch 0   No current facility-administered medications for this visit.    Allergies:   Latex    Social History:  The patient  reports that she has been smoking Cigarettes.  She has been smoking about 1.50 packs per day. She does not have any smokeless tobacco history on file. She reports that she drinks alcohol. She reports that she does not use illicit drugs.   Family History:  The patient's family history is not on file. She was adopted.    ROS:  Please see the history of present illness.    Review of Systems: Constitutional:  denies fever, chills, diaphoresis, appetite change and fatigue.  HEENT: denies photophobia, eye pain, redness, hearing loss, ear pain, congestion, sore throat, rhinorrhea, sneezing, neck pain, neck stiffness and tinnitus.  Respiratory: denies SOB, DOE, cough, chest tightness, and wheezing.  Cardiovascular: denies chest pain, palpitations  and leg swelling.  Gastrointestinal: denies nausea, vomiting, abdominal pain, diarrhea, constipation, blood in stool.  Genitourinary: denies dysuria, urgency, frequency, hematuria, flank pain and difficulty urinating.  Musculoskeletal: denies  myalgias, back pain, joint swelling, arthralgias and gait problem.   Skin: denies pallor, rash and wound.  Neurological: denies dizziness, seizures, syncope, weakness, light-headedness, numbness and headaches.   Hematological: denies adenopathy, easy bruising, personal or family bleeding history.  Psychiatric/ Behavioral: denies suicidal ideation, mood changes, confusion, nervousness, sleep disturbance and agitation.       All other systems are reviewed and negative.    PHYSICAL EXAM: VS:  BP 96/70 mmHg  Pulse 57  Ht 5\' 1"   (1.549 m)  Wt 151 lb 6.4 oz (68.675 kg)  BMI 28.62 kg/m2 , BMI Body mass index is 28.62 kg/(m^2). GEN: Well nourished, well developed, in no acute distress HEENT: normal Neck: no JVD, carotid bruits, or masses Cardiac: RR, soft systolic murmur, no rubs, or gallops,no edema  Respiratory:  clear to auscultation bilaterally, normal work of breathing GI: soft, nontender, nondistended, + BS MS: no deformity or atrophy Skin: warm and dry, no rash Neuro:  Strength and sensation are intact Psych: normal   EKG:  EKG is ordered today. The ekg ordered today demonstrates sinus brady at 57.     Recent Labs: 09/08/2015: Hemoglobin 14.5; Magnesium 2.0; Platelets 250; TSH 3.456 03/26/2016: ALT 17; BUN 12; Creat 0.72; Potassium 4.2; Sodium 139    Lipid Panel    Component Value Date/Time   CHOL 242* 03/26/2016 0839   TRIG 70 03/26/2016 0839   HDL 59 03/26/2016 0839   CHOLHDL 4.1 03/26/2016 0839   VLDL 14 03/26/2016 0839   LDLCALC 169* 03/26/2016 0839      Wt Readings from Last 3 Encounters:  03/28/16 151 lb 6.4 oz (68.675 kg)  09/30/15 151 lb 6.4 oz (68.675 kg)  09/13/15 152 lb 3.2 oz (69.037 kg)      Other studies Reviewed: Additional studies/ records that were reviewed today include: . Review of the above records demonstrates:    ASSESSMENT AND PLAN:  1.  Atrial fibrillation with rapid ventricular response.  CHADS2VASC score of 1 ( female )  Has converted back to sinus rhythm on amiodarone. I would prefer that she not be on amiodarone since she is fairly young. She was not tried on flecainide. Discussed with Dr. Lovena Le. We will stop the amiodarone. We will measure an amiodarone level in approximately 2 months and I'll see her several days after that. We will anticipate starting flecainide at that time. She'll measure her heart rate on a daily basis and will call if she develops any atrial fibrillation.    2.  Hyperlipidemia:     Will check lipids again in 6 months.   Current  medicines are reviewed at length with the patient today.  The patient does not have concerns regarding medicines. The following changes have been made:  no change  Labs/ tests ordered today include:  No orders of the defined types were placed in this encounter.    Disposition:   2 months OV    Mertie Moores, MD  03/28/2016 8:58 AM    [facility] [place], [place], [place]  [address] Phone: [phone]; Fax: [phone]   [facility]  [address] [employer] [place], [place]  [address] [phone]   Fax [phone]

## 2016-03-28 NOTE — Addendum Note (Signed)
Addended by: Lamar Laundry on: 03/28/2016 09:38 AM   Modules accepted: Orders

## 2016-03-28 NOTE — Patient Instructions (Addendum)
Medication Instructions:  Your physician has recommended you make the following change in your medication:  STOP Amiodarone START Atorvastatin 20mg  daily  Labwork: Your physician recommends that you return for lab work in: 2 months a couple days before follow up appointment (Amio level)  Your physician recommends that you return for a FASTING lipid profile and lft in 3 months    Testing/Procedures: None ordered  Follow-Up: Your physician recommends that you schedule a follow-up appointment in: 2 months with an EKG   Any Other Special Instructions Will Be Listed Below (If Applicable).     If you need a refill on your cardiac medications before your next appointment, please call your pharmacy.

## 2016-05-11 ENCOUNTER — Encounter: Payer: Self-pay | Admitting: Cardiovascular Disease

## 2016-05-28 ENCOUNTER — Other Ambulatory Visit (INDEPENDENT_AMBULATORY_CARE_PROVIDER_SITE_OTHER): Payer: BLUE CROSS/BLUE SHIELD | Admitting: *Deleted

## 2016-05-28 DIAGNOSIS — I480 Paroxysmal atrial fibrillation: Secondary | ICD-10-CM | POA: Diagnosis not present

## 2016-05-29 ENCOUNTER — Encounter: Payer: Self-pay | Admitting: Nurse Practitioner

## 2016-05-29 ENCOUNTER — Ambulatory Visit (INDEPENDENT_AMBULATORY_CARE_PROVIDER_SITE_OTHER): Payer: BLUE CROSS/BLUE SHIELD | Admitting: Nurse Practitioner

## 2016-05-29 VITALS — BP 112/72 | HR 73 | Ht 61.0 in | Wt 149.1 lb

## 2016-05-29 DIAGNOSIS — I480 Paroxysmal atrial fibrillation: Secondary | ICD-10-CM

## 2016-05-29 DIAGNOSIS — E785 Hyperlipidemia, unspecified: Secondary | ICD-10-CM

## 2016-05-29 NOTE — Progress Notes (Signed)
CARDIOLOGY OFFICE NOTE  Date:  05/29/2016    Wendy Brown Date of Birth: [birth year] Medical Record [id number]  PCP:  Lennie Odor PA-C  Cardiologist:  Nahser   Chief Complaint  Patient presents with  . Atrial Fibrillation    Follow up visit - seen for Dr. Acie Fredrickson    History of Present Illness: Wendy Brown is a 60 y.o. female who presents today for a 2 month check. Seen for Dr. Acie Fredrickson.   She has a history of AF and has been maintained on amiodarone. Last seen by Dr. Acie Fredrickson about 2 months ago - he did not wish for her to be on amiodarone due to her young age - it was stopped. She was to come back to consider initiation of Flecainide and have an amio level drawn. Other issues include HLD and vaping. She remains on chronic anticoagulation.   Comes in today. Here alone. She just had her amiodarone level drawn yesterday - it is not back. Not happy with her weight. Says she is not smoking. Has lost 2 pounds since last visit. Remains very anxious due to her home situation and chronic pain issues. She is not sure if she has had any recurrent arrhythmia.   Past Medical History:  Diagnosis Date  . A-fib ([place])   . Cataract     Past Surgical History:  Procedure Laterality Date  . ABDOMINAL HYSTERECTOMY    . CERVICAL DISC ARTHROPLASTY     NECK  . RIB FRACTURE SURGERY       Medications: Current Outpatient Prescriptions  Medication Sig Dispense Refill  . ALPRAZolam (XANAX) 0.25 MG tablet TK 1 T PO  ONCE D PRN  0  . apixaban (ELIQUIS) 5 MG TABS tablet Take 1 tablet (5 mg total) by mouth 2 (two) times daily. 60 tablet 11  . atorvastatin (LIPITOR) 20 MG tablet Take 1 tablet (20 mg total) by mouth daily. 90 tablet 3  . metoprolol succinate (TOPROL XL) 25 MG 24 hr tablet Take 1 tablet (25 mg total) by mouth daily. 30 tablet 11  . nicotine (NICODERM CQ - DOSED IN MG/24 HOURS) 14 mg/24hr patch Place 1 patch (14 mg total) onto the skin daily. 28 patch 0   No current  facility-administered medications for this visit.     Allergies: Allergies  Allergen Reactions  . Latex Rash    Social History: The patient  reports that she has quit smoking. Her smoking use included Cigarettes. She smoked 1.50 packs per day. She does not have any smokeless tobacco history on file. She reports that she drinks alcohol. She reports that she does not use drugs.   Family History: The patient's family history is not on file. She was adopted.   Review of Systems: Please see the history of present illness.   Otherwise, the review of systems is positive for none.   All other systems are reviewed and negative.   Physical Exam: VS:  BP 112/72   Pulse 73   Ht 5\' 1"  (1.549 m)   Wt 149 lb 1.9 oz (67.6 kg)   BMI 28.18 kg/m  .  BMI Body mass index is 28.18 kg/m.  Wt Readings from Last 3 Encounters:  05/29/16 149 lb 1.9 oz (67.6 kg)  03/28/16 151 lb 6.4 oz (68.7 kg)  09/30/15 151 lb 6.4 oz (68.7 kg)    General: Anxious.  Well developed, well nourished and in no acute distress.   HEENT: Normal.  Neck: Supple, no JVD,  carotid bruits, or masses noted.  Cardiac: Regular rate and rhythm. No murmurs, rubs, or gallops. No edema.  Respiratory:  Lungs are clear to auscultation bilaterally with normal work of breathing.  GI: Soft and nontender.  MS: No deformity or atrophy. Gait and ROM intact.  Skin: Warm and dry. Color is normal.  Neuro:  Strength and sensation are intact and no gross focal deficits noted.  Psych: Alert, appropriate and with normal affect.   LABORATORY DATA:  EKG:  EKG is ordered today. This demonstrates NSR.  Lab Results  Component Value Date   WBC 8.5 09/08/2015   HGB 14.5 09/08/2015   HCT 42.6 09/08/2015   PLT 250 09/08/2015   GLUCOSE 107 (H) 03/26/2016   CHOL 242 (H) 03/26/2016   TRIG 70 03/26/2016   HDL 59 03/26/2016   LDLCALC 169 (H) 03/26/2016   ALT 17 03/26/2016   AST 16 03/26/2016   NA 139 03/26/2016   K 4.2 03/26/2016   CL 104  03/26/2016   CREATININE 0.72 03/26/2016   BUN 12 03/26/2016   CO2 27 03/26/2016   TSH 3.456 09/08/2015   INR 1.0 01/25/2009   HGBA1C 5.8 (H) 09/08/2015    BNP (last 3 results) No results for input(s): BNP in the last 8760 hours.  ProBNP (last 3 results) No results for input(s): PROBNP in the last 8760 hours.   Other Studies Reviewed Today:  Echo Study Conclusions from 08/2015  - Left ventricle: The cavity size was normal. Wall thickness was   normal. Systolic function was normal. The estimated ejection   fraction was in the range of 50% to 55%. - Mitral valve: Mild prolapse of the anterior mitral leaflet There   was mild regurgitation. - Left atrium: The atrium was mildly dilated.  Assessment/Plan: 1. PAF - previously on amiodarone - she just had her level drawn yesterday - results are pending. Would hold on starting Flecainide until that is resulted. Would assume she will need GXT following initiation of Flecainide.   2. Chronic anticoagulation - no problems noted.   3. HLD  4. Anxiety  5. Smoking   Current medicines are reviewed with the patient today.  The patient does not have concerns regarding medicines other than what has been noted above.  The following changes have been made:  See above.  Labs/ tests ordered today include:    Orders Placed This Encounter  Procedures  . EKG 12-Lead     Disposition:   FU with Dr. Acie Fredrickson in 3 months. Further disposition to follow.   Patient is agreeable to this plan and will call if any problems develop in the interim.   Signed: Burtis Junes, RN, ANP-C 05/29/2016 11:29 AM  [facility] [address] [place], [place]  [address] Phone: [phone] Fax: [phone]

## 2016-05-29 NOTE — Patient Instructions (Addendum)
We will be checking the following labs today - NONE   Medication Instructions:    Continue with your current medicines.   When we get your amiodarone level back - Dr. Elmarie Shiley staff will then call you and start Flecainide.     Testing/Procedures To Be Arranged:  N/A  Follow-Up:   See Dr. Acie Fredrickson in 3 months   Other Special Instructions:   Ok to use plain antihistamines (no decongestants) or Flonase/Nasocort for allergies.    If you need a refill on your cardiac medications before your next appointment, please call your pharmacy.   Call the [place] office at [phone] if you have any questions, problems or concerns.

## 2016-05-31 LAB — AMIODARONE LEVEL
AMIODARONE LVL: 0.2 ug/mL — AB (ref 1.5–2.5)
DESETHYLAMIODARONE: 0.4 ug/mL — AB (ref 1.5–2.5)

## 2016-06-01 ENCOUNTER — Telehealth: Payer: Self-pay | Admitting: Nurse Practitioner

## 2016-06-01 DIAGNOSIS — Z79899 Other long term (current) drug therapy: Secondary | ICD-10-CM

## 2016-06-01 DIAGNOSIS — I4891 Unspecified atrial fibrillation: Secondary | ICD-10-CM

## 2016-06-01 DIAGNOSIS — Z5181 Encounter for therapeutic drug level monitoring: Secondary | ICD-10-CM

## 2016-06-01 MED ORDER — FLECAINIDE ACETATE 50 MG PO TABS: 50.0000 mg | ORAL_TABLET | Freq: Two times a day (BID) | ORAL | 11 refills | Status: DC

## 2016-06-01 NOTE — Telephone Encounter (Signed)
-----   Message from Thayer Headings, MD sent at 06/01/2016  7:40 AM EDT ----- Amio levels are low Continue metoprolol OK to start Flecainide 50 mg BID Schedule a GXT Myoview  in 2 weeks. She should continue the metoprolol and flecainide for the GXT. If she is not able to reach target HR ( since she will be on metoprolol) we can add  Lexiscan.   I still want her to walk as far as she can to evaluate the effects of Flecainide during maximal exercise.

## 2016-06-01 NOTE — Telephone Encounter (Signed)
Reviewed lab results and plan of care with patient.  She verbalized understanding and agreement to start Flecainide 50 mg twice daily.  She has stopped amiodarone as directed by Truitt Merle, NP on 8/8 and was awaiting lab work for initiation of flecainide.  She agrees to continue metoprolol.  I advised she will need stress test in 2 weeks after starting the medication.  I have scheduled her on the CVD Treadmill schedule but will verify that Dr. Acie Fredrickson wants GXT vs exercise myoview.  I advised I will call her back next week to schedule and for her to call back with questions or concerns prior to appointment. Patient verbalized understanding and agreement.

## 2016-06-04 NOTE — Telephone Encounter (Signed)
Thayer Headings, MD  Emmaline Life, RN        Exercise myoivew please  We will "kill 2 birds with one stone" by getting the exercise test to look for proarrhythmia due to Flecainide and will evaluate for ischemia with the myoview   Thanks   Phil    Order for GXT cancelled and exercise myocardial perfusion study ordered

## 2016-06-12 ENCOUNTER — Telehealth (HOSPITAL_COMMUNITY): Payer: Self-pay | Admitting: *Deleted

## 2016-06-12 NOTE — Telephone Encounter (Signed)
Patient given detailed instructions per Myocardial Perfusion Study Information Sheet for the test on 06/15/16 at 0930. Patient notified to arrive 15 minutes early and that it is imperative to arrive on time for appointment to keep from having the test rescheduled.  If you need to cancel or reschedule your appointment, please call the office within 24 hours of your appointment. Failure to do so may result in a cancellation of your appointment, and a $50 no show fee. Patient verbalized understanding.Benedict Kue, [place]e [place]

## 2016-06-15 ENCOUNTER — Ambulatory Visit (HOSPITAL_COMMUNITY): Payer: BLUE CROSS/BLUE SHIELD | Attending: Cardiology

## 2016-06-15 DIAGNOSIS — Z79899 Other long term (current) drug therapy: Secondary | ICD-10-CM | POA: Diagnosis not present

## 2016-06-15 DIAGNOSIS — Z5181 Encounter for therapeutic drug level monitoring: Secondary | ICD-10-CM | POA: Diagnosis not present

## 2016-06-15 DIAGNOSIS — R0602 Shortness of breath: Secondary | ICD-10-CM | POA: Diagnosis not present

## 2016-06-15 DIAGNOSIS — I4891 Unspecified atrial fibrillation: Secondary | ICD-10-CM | POA: Insufficient documentation

## 2016-06-15 DIAGNOSIS — R002 Palpitations: Secondary | ICD-10-CM | POA: Diagnosis not present

## 2016-06-15 DIAGNOSIS — R079 Chest pain, unspecified: Secondary | ICD-10-CM | POA: Insufficient documentation

## 2016-06-15 MED ORDER — REGADENOSON 0.4 MG/5ML IV SOLN
0.4000 mg | Freq: Once | INTRAVENOUS | Status: AC
  Administered 2016-06-15: 0.4 mg via INTRAVENOUS

## 2016-06-15 MED ORDER — TECHNETIUM TC 99M TETROFOSMIN IV KIT
10.5000 | PACK | Freq: Once | INTRAVENOUS | Status: AC | PRN
  Administered 2016-06-15: 11 via INTRAVENOUS
  Filled 2016-06-15: qty 11

## 2016-06-15 MED ORDER — TECHNETIUM TC 99M TETROFOSMIN IV KIT
31.3000 | PACK | Freq: Once | INTRAVENOUS | Status: AC | PRN
  Administered 2016-06-15: 31.3 via INTRAVENOUS
  Filled 2016-06-15: qty 31

## 2016-06-17 LAB — MYOCARDIAL PERFUSION IMAGING
CHL CUP NUCLEAR SDS: 0
CHL CUP NUCLEAR SRS: 0
CHL CUP RESTING HR STRESS: 57 {beats}/min
CSEPPHR: 101 {beats}/min
LHR: 0.31
LV dias vol: 80 mL (ref 46–106)
LV sys vol: 29 mL
SSS: 0
TID: 1.24

## 2016-06-19 NOTE — Progress Notes (Signed)
**  LM 8/29

## 2016-06-28 ENCOUNTER — Other Ambulatory Visit: Payer: BLUE CROSS/BLUE SHIELD | Admitting: *Deleted

## 2016-06-28 DIAGNOSIS — E785 Hyperlipidemia, unspecified: Secondary | ICD-10-CM | POA: Diagnosis not present

## 2016-06-28 LAB — LIPID PANEL
CHOL/HDL RATIO: 2.6 ratio (ref <=5.0)
Cholesterol: 160 mg/dL (ref 125–200)
HDL: 61 mg/dL (ref >=46)
LDL CALC: 86 mg/dL (ref <130)
TRIGLYCERIDES: 65 mg/dL (ref <150)
VLDL: 13 mg/dL (ref <30)

## 2016-06-28 LAB — HEPATIC FUNCTION PANEL
ALT: 15 U/L (ref 6–29)
AST: 16 U/L (ref 10–35)
Albumin: 4.2 g/dL (ref 3.6–5.1)
Alkaline Phosphatase: 82 U/L (ref 33–130)
BILIRUBIN INDIRECT: 0.3 mg/dL (ref 0.2–1.2)
Bilirubin, Direct: 0.1 mg/dL (ref <=0.2)
TOTAL PROTEIN: 6.9 g/dL (ref 6.1–8.1)
Total Bilirubin: 0.4 mg/dL (ref 0.2–1.2)

## 2016-07-30 ENCOUNTER — Telehealth: Payer: Self-pay | Admitting: Cardiovascular Disease

## 2016-07-30 NOTE — Telephone Encounter (Signed)
Spoke with patient who states for the past couple of weeks she has noticed increased fatigue, legs feeling heavy, occasional dizziness and constipation.  States she is eating healthier and trying to lose weight, yet has gained a couple of pounds. She also c/o "feeling out of it" but forces herself to keep moving.  She started flecainide 06/01/16 and follow-up stress test done 06/15/16.  She thinks her symptoms are r/t flecainide.  She states she does not think she has been in a fib since starting the medication.  I advised that it may be difficult to find another medication that controls her a fib.  I encouraged her to hold the Lipitor for 2 weeks to see if symptoms resolve.  She is hesitant to do so as she thinks symptoms are more related to flecainide.  I advised that I can schedule an appointment with an APP for her and she agrees.  She is scheduled with Wendy Barrios, PA tomorrow 10/10.  I am forwarding to Dr. Acie Fredrickson for advice.

## 2016-07-30 NOTE — Telephone Encounter (Signed)
I agree with note by Christen Bame, RN to hold her Atorvastatin for several weeks to see if she feels better.  I doubt these symptoms are due to Flecainide

## 2016-07-30 NOTE — Telephone Encounter (Signed)
Wendy Brown is calling about the side effects from the new medication(Flecainide and the Lipitor)  , which she just started on this summer . Please Call

## 2016-07-31 ENCOUNTER — Ambulatory Visit (INDEPENDENT_AMBULATORY_CARE_PROVIDER_SITE_OTHER): Payer: BLUE CROSS/BLUE SHIELD | Admitting: Physician Assistant

## 2016-07-31 ENCOUNTER — Encounter: Payer: Self-pay | Admitting: Physician Assistant

## 2016-07-31 VITALS — BP 110/58 | HR 59 | Ht 61.0 in | Wt 152.8 lb

## 2016-07-31 DIAGNOSIS — Z79899 Other long term (current) drug therapy: Secondary | ICD-10-CM | POA: Diagnosis not present

## 2016-07-31 DIAGNOSIS — K5903 Drug induced constipation: Secondary | ICD-10-CM

## 2016-07-31 DIAGNOSIS — Z5181 Encounter for therapeutic drug level monitoring: Secondary | ICD-10-CM

## 2016-07-31 DIAGNOSIS — I4891 Unspecified atrial fibrillation: Secondary | ICD-10-CM

## 2016-07-31 NOTE — Patient Instructions (Signed)
Medication Instructions:  Stop atorvastatin (lipitor).  Labwork: None   Testing/Procedures: None   Follow-Up: Your physician recommends that you schedule a follow-up appointment in: 1 month with Dr Acie Fredrickson.        If you need a refill on your cardiac medications before your next appointment, please call your pharmacy.

## 2016-07-31 NOTE — Progress Notes (Signed)
Cardiology Office Note    Date:  07/31/2016   ID:  Tiffony, Clarizio 12-20-1955, MRN [id number]  PCP:  REDMON,NOELLE, PA-C  Cardiologist: Dr. Acie Fredrickson  Chief Complaint  Patient presents with  . Constipation    History of Present Illness:  Wendy Brown is a 60 y.o. female with PAF who converted to normal sinus rhythm on amiodarone. Because of her age Dr. Acie Fredrickson recommended stopping amiodarone and beginning flecainide. Nuclear stress test 06/15/16 was normal. The patient called in yesterday complaining of increased fatigue, legs feeling heavy, occasional dizziness and constipation. She feels these symptoms are also secondary to the flecainide. Dr. Acie Fredrickson recommended holding her atorvastatin for several weeks to see if she feels better and he did not think the symptoms were coming from flecainide.  Patient has a lot of stress in her life. Very emotional. Trying to lose weight but can't. Legs feel heavy. Has been off balance for awhile. Constipated. Has maintained normal sinus rhythm on flecainide. No palpitations or chest pain.   Past Medical History:  Diagnosis Date  . A-fib ([place])   . Cataract     Past Surgical History:  Procedure Laterality Date  . ABDOMINAL HYSTERECTOMY    . CERVICAL DISC ARTHROPLASTY     NECK  . RIB FRACTURE SURGERY      Current Medications: Outpatient Medications Prior to Visit  Medication Sig Dispense Refill  . apixaban (ELIQUIS) 5 MG TABS tablet Take 1 tablet (5 mg total) by mouth 2 (two) times daily. 60 tablet 11  . flecainide (TAMBOCOR) 50 MG tablet Take 1 tablet (50 mg total) by mouth 2 (two) times daily. 60 tablet 11  . metoprolol succinate (TOPROL XL) 25 MG 24 hr tablet Take 1 tablet (25 mg total) by mouth daily. 30 tablet 11  . atorvastatin (LIPITOR) 20 MG tablet Take 1 tablet (20 mg total) by mouth daily. 90 tablet 3  . ALPRAZolam (XANAX) 0.25 MG tablet TK 1 T PO  ONCE D PRN  0  . nicotine (NICODERM CQ - DOSED IN MG/24 HOURS) 14 mg/24hr  patch Place 1 patch (14 mg total) onto the skin daily. (Patient not taking: Reported on 07/31/2016) 28 patch 0   No facility-administered medications prior to visit.      Allergies:   Atorvastatin and Latex   Social History   Social History  . Marital status: Single    Spouse name: N/A  . Number of children: 4  . Years of education: 12   Occupational History  . UNEMPLOYED    Social History Main Topics  . Smoking status: Former Smoker    Packs/day: 1.50    Types: Cigarettes  . Smokeless tobacco: Never Used  . Alcohol use 0.0 oz/week     Comment: OCCASSIONALLY  . Drug use: No  . Sexual activity: Not Asked   Other Topics Concern  . None   Social History Narrative  . None     Family History:  The patient's family history is not on file. She was adopted.   ROS:   Please see the history of present illness.    Review of Systems  Constitution: Positive for weakness and malaise/fatigue.  HENT: Positive for headaches.   Eyes: Negative.   Cardiovascular: Negative.   Respiratory: Negative.   Hematologic/Lymphatic: Bruises/bleeds easily.  Musculoskeletal: Negative.  Negative for joint pain.  Gastrointestinal: Positive for constipation.  Genitourinary: Negative.   Psychiatric/Behavioral: Positive for memory loss. The patient is nervous/anxious.    All other  systems reviewed and are negative.   PHYSICAL EXAM:   VS:  BP (!) 110/58   Pulse (!) 59   Ht 5\' 1"  (1.549 m)   Wt 152 lb 12.8 oz (69.3 kg)   BMI 28.87 kg/m   Physical Exam  GEN: Well nourished, well developed, in no acute distress  Neck: no JVD, carotid bruits, or masses Cardiac:RRR; no murmurs, rubs, or gallops  Respiratory:  clear to auscultation bilaterally, normal work of breathing GI: soft, nontender, nondistended, + BS Ext: without cyanosis, clubbing, or edema, Good distal pulses bilaterally MS: no deformity or atrophy  Skin: warm and dry, no rash Psych: Depressed  Wt Readings from Last 3 Encounters:    07/31/16 152 lb 12.8 oz (69.3 kg)  05/29/16 149 lb 1.9 oz (67.6 kg)  03/28/16 151 lb 6.4 oz (68.7 kg)      Studies/Labs Reviewed:   EKG:  EKG is ordered today.  The ekg ordered today demonstrates Sinus bradycardia at 59 bpm  Recent Labs: 09/08/2015: Hemoglobin 14.5; Magnesium 2.0; Platelets 250; TSH 3.456 03/26/2016: BUN 12; Creat 0.72; Potassium 4.2; Sodium 139 06/28/2016: ALT 15   Lipid Panel    Component Value Date/Time   CHOL 160 06/28/2016 0802   TRIG 65 06/28/2016 0802   HDL 61 06/28/2016 0802   CHOLHDL 2.6 06/28/2016 0802   VLDL 13 06/28/2016 0802   LDLCALC 86 06/28/2016 0802    Additional studies/ records that were reviewed today include:  Nuclear stress test 06/15/16  Nuclear stress EF: 64%.  There was no ST segment deviation noted during stress.  No T wave inversion was noted during stress.  The study is normal.  This is a low risk study.   Low risk stress nuclear study with normal perfusion and normal left ventricular regional and global systolic function. Mild breast attenuation artifact is seen.   2-D echo 11/2016Study Conclusions   - Left ventricle: The cavity size was normal. Wall thickness was   normal. Systolic function was normal. The estimated ejection   fraction was in the range of 50% to 55%. - Mitral valve: Mild prolapse of the anterior mitral leaflet There   was mild regurgitation. - Left atrium: The atrium was mildly dilated.   Transthoracic echocardiography.  M-mode, complete 2D, spectral Doppler, and color Doppler.  Birthdate:  Patient birthdate: 12-05-1955.  Age:  Patient is 60 yr old.  Sex:  Gender: female. BMI: 27.4 kg/m^2.  Blood pressure:     93/68  Patient status: Inpatient.  Study date:  Study date: 09/09/2015. Study time: 10:15 AM.  Location:  Bedside.   -------------------------------------------------------------------    ASSESSMENT:    1. Atrial fibrillation, unspecified type ([place])   2. Drug-induced constipation   3.  Encounter for monitoring anti-arrhythmic therapy      PLAN:  In order of problems listed above:  Atrial fibrillation has converted to normal sinus rhythm with flecainide. Patient is having multiple side effects that she contributes to the flecainide. Some these could be coming from flecainide including constipation and somnolence. Dr. Acie Fredrickson would like her to stop the Lipitor for 4 weeks to see if her symptoms improved since this was started at the same time. She is willing to try this option.  Drug-induced constipation. I told her she could try a laxative but to not take it for several hours after her flecainide.  Encounter for monitoring antiarrhythmic therapy. Hopefully patient will adjust to the flecainide as she is maintaining normal sinus rhythm on this. If not she  may need to be started on another antiarrhythmic. Follow-up with Dr.Nahser.    Medication Adjustments/Labs and Tests Ordered: Current medicines are reviewed at length with the patient today.  Concerns regarding medicines are outlined above.  Medication changes, Labs and Tests ordered today are listed in the Patient Instructions below. Patient Instructions  Medication Instructions:  Stop atorvastatin (lipitor).  Labwork: None   Testing/Procedures: None   Follow-Up: Your physician recommends that you schedule a follow-up appointment in: 1 month with Dr Acie Fredrickson.        If you need a refill on your cardiac medications before your next appointment, please call your pharmacy.      Signed, Ermalinda Barrios, PA-C  07/31/2016 11:03 AM    [facility] [place], [place], [place]  [address] Phone: [phone]; Fax: [phone]

## 2016-08-15 DIAGNOSIS — Z23 Encounter for immunization: Secondary | ICD-10-CM | POA: Diagnosis not present

## 2016-08-22 DIAGNOSIS — M170 Bilateral primary osteoarthritis of knee: Secondary | ICD-10-CM | POA: Diagnosis not present

## 2016-08-22 DIAGNOSIS — M542 Cervicalgia: Secondary | ICD-10-CM | POA: Diagnosis not present

## 2016-08-22 DIAGNOSIS — M25562 Pain in left knee: Secondary | ICD-10-CM | POA: Diagnosis not present

## 2016-08-22 DIAGNOSIS — M25561 Pain in right knee: Secondary | ICD-10-CM | POA: Diagnosis not present

## 2016-08-22 DIAGNOSIS — M4722 Other spondylosis with radiculopathy, cervical region: Secondary | ICD-10-CM | POA: Diagnosis not present

## 2016-08-23 ENCOUNTER — Other Ambulatory Visit: Payer: Self-pay | Admitting: Cardiovascular Disease

## 2016-08-23 ENCOUNTER — Telehealth: Payer: Self-pay | Admitting: Cardiovascular Disease

## 2016-08-23 NOTE — Telephone Encounter (Signed)
New Message:    Pt says her heart rate is up. She had some Cortisone injections,wonder if this might be the reason that her heart rate is up.

## 2016-08-23 NOTE — Telephone Encounter (Signed)
Spoke with patient who states she continues to feel anxious about her elevated heart rate.  She cannot determine if the pulse is regular or irregular.  She took an additional metoprolol and states heart rate remains in the 90's to 100's bpm. She is very anxious and thinks the cortisone injections are causing her symptoms.  She asked me to review her test results and the reasons for each of her medications. These good test results were reassuring to her.  I answered her questions to her satisfaction.  By the end of our conversation she reports vital signs:  128/73 mmHg, pulse 90 bpm I encouraged her to relax and recheck just 1-2 times daily unless she is feeling symptomatic.  She is going away for the weekend and I advised her that she should seek medical attention if her rate is consistently > 110's bpm and/or if symptoms of fatigue, SOB, chest fluttering, or pain occur or worsen.  She verbalized understanding and agreement and thanked me for the call.

## 2016-08-23 NOTE — Telephone Encounter (Addendum)
Spoke with patient who states she has been feeling well recently until yesterday.  States she had cortisone injections in knee and back yesterday and now heart rate is elevated.  Her family told her she should have called cardiologist before getting the injection which has caused her stress level to rise and is also contributing to an increased pulse. She has a stressful home environment and has a lot of anxiety about the a fib and the medications.  I advised her that we would not have prohibited her from getting injections for pain and that it might have caused a slight increase in pulse but nothing to panic about. She reports pulse is 85-100 bpm since yesterday and usually heart rate is 60-77 bpm.  She denies fatigue or SOB. I advised her to continue to monitor pulse and that normal range of heart rate is 60-100 bpm.  I advised that she should not be concerned unless she notices symptoms or heart rate >100 on a consistent basis.  She takes Xanax for anxiety and I advised that she could take those as directed, which would help lower her stress level and incidentally also lower her pulse.  She verbalized understanding and agreement with plan and thanked me for the call.

## 2016-08-23 NOTE — Telephone Encounter (Signed)
Pt calling to verify something Sharyn Lull told her earlier and has another question

## 2016-09-03 ENCOUNTER — Encounter: Payer: Self-pay | Admitting: Cardiovascular Disease

## 2016-09-03 ENCOUNTER — Ambulatory Visit (INDEPENDENT_AMBULATORY_CARE_PROVIDER_SITE_OTHER): Payer: BLUE CROSS/BLUE SHIELD | Admitting: Cardiovascular Disease

## 2016-09-03 VITALS — BP 100/70 | HR 67 | Ht 60.0 in | Wt 152.8 lb

## 2016-09-03 DIAGNOSIS — I480 Paroxysmal atrial fibrillation: Secondary | ICD-10-CM

## 2016-09-03 MED ORDER — APIXABAN 5 MG PO TABS: ORAL_TABLET | ORAL | 11 refills | Status: DC

## 2016-09-03 MED ORDER — METOPROLOL SUCCINATE ER 25 MG PO TB24: ORAL_TABLET | ORAL | 11 refills | Status: DC

## 2016-09-03 NOTE — Progress Notes (Signed)
Cardiology Office Note   Date:  09/03/2016   ID:  Wendy Brown, DOB [birth year], MRN [id number]  PCP:  REDMON,NOELLE, PA-C  Cardiologist:   Mertie Moores, MD   Chief Complaint  Patient presents with  . Follow-up    paroxysmal atrial fib    1. Problem list 1. Atrial fibrillation   History of Present Illness: Wendy Brown is a 60 y.o. female who presents for new onset / new diagnosis of atrial fib.  Seen with daughter, [name] .   Was to have cataract surgery this am.  Was noticed to have rapid atrial fib.  Was sent to her primary care doctor who called [place] for advice.   She denies any chest pain or shortness of breath. She really cannot tell that her heart rate is regular. She knows that she's had palpitations over the past years   Has seen Dr. Olevia Perches and had atrial fib about 6 years ( found after she fell through the attic floor)  Does not work - takes care of her disabled son.  No CP , no dyspnea.   Can have DOE walking up stairs.   Has lots of MSK pain related to her fall in 2010 - sleeps in a recliner   Dec. 9, 2016:  Wendy Brown was admitted to the hospital with rapid atrial fib.  Was started on amiodarone. Has converted to NSR ,  OnEliuqis.  Feeling better.  Has cut her coffee intake.  Eating lot fat meat.   Doing well.  No additional episodes of Afib.   Getting some exercise.    Was a bit fatigued immediately after she left the hospital because of all the new medications. Has been using Vaping cigarettes.    March 28, 2016:  Doing well. Still in NSR , on amio 200 a day   Nov. 13, 2017:  Wendy Brown is seen today for her PAF Had cortisone injections and had heart palpitations as a side effect.  PAF seems to be better controlled on the flecainide  Not exercising much.    Has some neck and back issues.   Has quit smoking    Past Medical History:  Diagnosis Date  . A-fib ([place])   . Cataract     Past Surgical History:  Procedure Laterality Date  .  ABDOMINAL HYSTERECTOMY    . CERVICAL DISC ARTHROPLASTY     NECK  . RIB FRACTURE SURGERY       Current Outpatient Prescriptions  Medication Sig Dispense Refill  . ELIQUIS 5 MG TABS tablet TAKE 1 TABLET(5 MG) BY MOUTH TWICE DAILY 60 tablet 0  . flecainide (TAMBOCOR) 50 MG tablet Take 1 tablet (50 mg total) by mouth 2 (two) times daily. 60 tablet 11  . metoprolol succinate (TOPROL-XL) 25 MG 24 hr tablet TAKE 1 TABLET(25 MG) BY MOUTH DAILY 30 tablet 0   No current facility-administered medications for this visit.     Allergies:   Atorvastatin and Latex    Social History:  The patient  reports that she has quit smoking. Her smoking use included Cigarettes. She smoked 1.50 packs per day. She has never used smokeless tobacco. She reports that she drinks alcohol. She reports that she does not use drugs.   Family History:  The patient's family history is not on file. She was adopted.    ROS:  Please see the history of present illness.    Review of Systems: Constitutional:  denies fever, chills, diaphoresis, appetite change and fatigue.  HEENT: denies photophobia, eye pain, redness, hearing loss, ear pain, congestion, sore throat, rhinorrhea, sneezing, neck pain, neck stiffness and tinnitus.  Respiratory: denies SOB, DOE, cough, chest tightness, and wheezing.  Cardiovascular: denies chest pain, palpitations and leg swelling.  Gastrointestinal: denies nausea, vomiting, abdominal pain, diarrhea, constipation, blood in stool.  Genitourinary: denies dysuria, urgency, frequency, hematuria, flank pain and difficulty urinating.  Musculoskeletal: denies  myalgias, back pain, joint swelling, arthralgias and gait problem.   Skin: denies pallor, rash and wound.  Neurological: denies dizziness, seizures, syncope, weakness, light-headedness, numbness and headaches.   Hematological: denies adenopathy, easy bruising, personal or family bleeding history.  Psychiatric/ Behavioral: denies suicidal  ideation, mood changes, confusion, nervousness, sleep disturbance and agitation.       All other systems are reviewed and negative.    PHYSICAL EXAM: VS:  BP 100/70   Pulse 67   Ht 5' (1.524 m)   Wt 152 lb 12.8 oz (69.3 kg)   SpO2 94%   BMI 29.84 kg/m  , BMI Body mass index is 29.84 kg/m. GEN: Well nourished, well developed, in no acute distress  HEENT: normal  Neck: no JVD, carotid bruits, or masses Cardiac: RR, soft systolic murmur, no rubs, or gallops,no edema  Respiratory:  clear to auscultation bilaterally, normal work of breathing GI: soft, nontender, nondistended, + BS MS: no deformity or atrophy  Skin: warm and dry, no rash Neuro:  Strength and sensation are intact Psych: normal   EKG:  EKG is not ordered today.   Recent Labs: 09/08/2015: Hemoglobin 14.5; Magnesium 2.0; Platelets 250; TSH 3.456 03/26/2016: BUN 12; Creat 0.72; Potassium 4.2; Sodium 139 06/28/2016: ALT 15    Lipid Panel    Component Value Date/Time   CHOL 160 06/28/2016 0802   TRIG 65 06/28/2016 0802   HDL 61 06/28/2016 0802   CHOLHDL 2.6 06/28/2016 0802   VLDL 13 06/28/2016 0802   LDLCALC 86 06/28/2016 0802      Wt Readings from Last 3 Encounters:  09/03/16 152 lb 12.8 oz (69.3 kg)  07/31/16 152 lb 12.8 oz (69.3 kg)  05/29/16 149 lb 1.9 oz (67.6 kg)      Other studies Reviewed: Additional studies/ records that were reviewed today include: . Review of the above records demonstrates:    ASSESSMENT AND PLAN:  1.  Atrial fibrillation with rapid ventricular response.  CHADS2VASC score of 1 ( female )  Has converted back to sinus rhythm .  Staying in NSR on Flecainide.  Has lots of stress related issues.     2. Anxiety :   Has lots of anxiety issues.  Will defer to her primary MD    Mertie Moores, MD  09/03/2016 11:16 AM    [facility] [address], [place], [place]  [address] Phone: [phone]; Fax: [phone]

## 2016-09-03 NOTE — Patient Instructions (Signed)
Your physician recommends that you continue on your current medications as directed. Please refer to the Current Medication list given to you today.  Your physician wants you to follow-up in: 6 months with Dr. Acie Fredrickson.  You will receive a reminder letter in the mail two months in advance. If you don't receive a letter, please call our office to schedule the follow-up appointment.  Your physician recommends that you return for lab work in: 6 months (BMET, LIPID PANEL, LIVER FUNCTION)

## 2016-10-17 DIAGNOSIS — M542 Cervicalgia: Secondary | ICD-10-CM | POA: Diagnosis not present

## 2016-10-17 DIAGNOSIS — M4722 Other spondylosis with radiculopathy, cervical region: Secondary | ICD-10-CM | POA: Diagnosis not present

## 2016-10-17 DIAGNOSIS — M25561 Pain in right knee: Secondary | ICD-10-CM | POA: Diagnosis not present

## 2016-10-17 DIAGNOSIS — M25562 Pain in left knee: Secondary | ICD-10-CM | POA: Diagnosis not present

## 2016-11-14 DIAGNOSIS — R0789 Other chest pain: Secondary | ICD-10-CM | POA: Diagnosis not present

## 2016-11-19 ENCOUNTER — Telehealth: Payer: Self-pay | Admitting: Cardiovascular Disease

## 2016-11-19 NOTE — Telephone Encounter (Signed)
New Message:    Pt needs her tattoo that needs touching up on her breat. She is on Eliquis and wants to know if this will be alright to do?

## 2016-11-19 NOTE — Telephone Encounter (Signed)
Spoke with patient who states she is having a minor touch up to her tattoo and should have minimal bleeding. She states she spoke with the tattoo artist and he advised that he thinks she will have no problems with bleeding. I advised that per Dr. Acie Fredrickson, she may hold eliquis for 2 days. She states she would prefer not to hold it. I advised that she may hold or continue per her preference. She states she will likely try to get the touch up done without stopping eliquis and if bleeding occurs she will stop eliquis and reschedule.

## 2016-11-23 ENCOUNTER — Emergency Department (HOSPITAL_COMMUNITY)
Admission: EM | Admit: 2016-11-23 | Discharge: 2016-11-23 | Disposition: A | Discharge status: Home / Self Care | Payer: BLUE CROSS/BLUE SHIELD | Attending: Emergency Medicine | Admitting: Emergency Medicine

## 2016-11-23 ENCOUNTER — Telehealth: Payer: Self-pay | Admitting: Cardiology

## 2016-11-23 ENCOUNTER — Encounter (HOSPITAL_COMMUNITY): Payer: Self-pay | Admitting: Emergency Medicine

## 2016-11-23 DIAGNOSIS — Z87891 Personal history of nicotine dependence: Secondary | ICD-10-CM | POA: Diagnosis not present

## 2016-11-23 DIAGNOSIS — E876 Hypokalemia: Secondary | ICD-10-CM | POA: Diagnosis not present

## 2016-11-23 DIAGNOSIS — Z7901 Long term (current) use of anticoagulants: Secondary | ICD-10-CM | POA: Insufficient documentation

## 2016-11-23 DIAGNOSIS — I480 Paroxysmal atrial fibrillation: Secondary | ICD-10-CM | POA: Diagnosis not present

## 2016-11-23 DIAGNOSIS — E860 Dehydration: Secondary | ICD-10-CM | POA: Diagnosis not present

## 2016-11-23 DIAGNOSIS — R002 Palpitations: Secondary | ICD-10-CM | POA: Diagnosis not present

## 2016-11-23 DIAGNOSIS — Z91040 Latex allergy status: Secondary | ICD-10-CM | POA: Diagnosis not present

## 2016-11-23 LAB — BASIC METABOLIC PANEL
Anion gap: 13 (ref 5–15)
BUN: 13 mg/dL (ref 6–20)
CALCIUM: 9.7 mg/dL (ref 8.9–10.3)
CO2: 25 mmol/L (ref 22–32)
CREATININE: 0.97 mg/dL (ref 0.44–1.00)
Chloride: 102 mmol/L (ref 101–111)
GFR calc Af Amer: >60 mL/min (ref >60)
Glucose, Bld: 105 mg/dL — ABNORMAL HIGH (ref 65–99)
Potassium: 2.9 mmol/L — ABNORMAL LOW (ref 3.5–5.1)
SODIUM: 140 mmol/L (ref 135–145)

## 2016-11-23 LAB — CBC
HCT: 42.9 % (ref 36.0–46.0)
Hemoglobin: 14.8 g/dL (ref 12.0–15.0)
MCH: 33.9 pg (ref 26.0–34.0)
MCHC: 34.5 g/dL (ref 30.0–36.0)
MCV: 98.4 fL (ref 78.0–100.0)
PLATELETS: 289 10*3/uL (ref 150–400)
RBC: 4.36 MIL/uL (ref 3.87–5.11)
RDW: 14.2 % (ref 11.5–15.5)
WBC: 8.8 10*3/uL (ref 4.0–10.5)

## 2016-11-23 LAB — MAGNESIUM: MAGNESIUM: 2.1 mg/dL (ref 1.7–2.4)

## 2016-11-23 MED ORDER — FLECAINIDE ACETATE 50 MG PO TABS
50.0000 mg | ORAL_TABLET | Freq: Once | ORAL | Status: AC
  Administered 2016-11-23: 50 mg via ORAL
  Filled 2016-11-23 (×2): qty 1

## 2016-11-23 MED ORDER — POTASSIUM CHLORIDE CRYS ER 20 MEQ PO TBCR
40.0000 meq | EXTENDED_RELEASE_TABLET | Freq: Once | ORAL | Status: AC
  Administered 2016-11-23: 40 meq via ORAL
  Filled 2016-11-23: qty 2

## 2016-11-23 MED ORDER — APIXABAN 5 MG PO TABS
5.0000 mg | ORAL_TABLET | Freq: Once | ORAL | Status: AC
  Administered 2016-11-23: 5 mg via ORAL
  Filled 2016-11-23 (×2): qty 1

## 2016-11-23 MED ORDER — POTASSIUM CHLORIDE CRYS ER 20 MEQ PO TBCR: 40.0000 meq | EXTENDED_RELEASE_TABLET | Freq: Two times a day (BID) | ORAL | 0 refills | Status: DC

## 2016-11-23 MED ORDER — SODIUM CHLORIDE 0.9 % IV BOLUS (SEPSIS)
1000.0000 mL | Freq: Once | INTRAVENOUS | Status: AC
  Administered 2016-11-23: 1000 mL via INTRAVENOUS

## 2016-11-23 MED ORDER — METOPROLOL TARTRATE 5 MG/5ML IV SOLN
5.0000 mg | Freq: Once | INTRAVENOUS | Status: AC
  Administered 2016-11-23: 5 mg via INTRAVENOUS
  Filled 2016-11-23: qty 5

## 2016-11-23 NOTE — Telephone Encounter (Signed)
Pt called stating that she has felt anxious since around 5pm and thought that her HR may be up. Took HR at 5:30 with her monitor and noted 130s. On flecainide, meto, and eliquis. Took an extra meto, but rate still seems to up elevated in the 130s. Has been compliant with [place]. Instructed that since she is symptomatic, and rate is elevated she should seek medical treatment. Asked that she have someone drive her, instead of driving herself. Also noted to have a hx of anxiety issues and has been under increased amount of stress.

## 2016-11-23 NOTE — ED Notes (Signed)
Lopressor given  Heart rtae slowed to 92  llooks like she converted to nsr

## 2016-11-23 NOTE — ED Provider Notes (Signed)
[facility] DEPT Provider Note   CSN: [id number] Arrival date & time: 11/23/16  1944     History   Chief Complaint Chief Complaint  Patient presents with  . Palpitations    HPI Wendy Brown is a 61 y.o. female.  HPI 61 yo F with PMHx of AFib on flecainide/eliquis here with palpitations. Pt states she has been under significant increased stress over the past several days/week. She has not been sleeping well and drinking less than usual. Earlier tonight, after eating, she experienced acute onset of palpitations. She felt that her heart was beating very quickly and irregularly. She took her pulse at home and noticed it was approx 130s, so she presents for evaluation. Denies any chest pain associated with it. No nausea, no vomiting. She took an extra dose of metoprolol and was sent here for evaluation.  Past Medical History:  Diagnosis Date  . A-fib ([place])   . Cataract     Patient Active Problem List   Diagnosis Date Noted  . ACUTE POSTHEMORRHAGIC ANEMIA 03/02/2009  . Atrial fibrillation ([place]) 03/02/2009  . CLOSED FRACTURE OF UNSPECIFIED BONE 03/02/2009    Past Surgical History:  Procedure Laterality Date  . ABDOMINAL HYSTERECTOMY    . CERVICAL DISC ARTHROPLASTY     NECK  . RIB FRACTURE SURGERY      OB History    No data available       Home Medications    Prior to Admission medications   Medication Sig Start Date End Date Taking? Authorizing Provider  apixaban (ELIQUIS) 5 MG TABS tablet TAKE 1 TABLET(5 MG) BY MOUTH TWICE DAILY 09/03/16   Thayer Headings, MD  flecainide (TAMBOCOR) 50 MG tablet Take 1 tablet (50 mg total) by mouth 2 (two) times daily. 06/01/16   Thayer Headings, MD  metoprolol succinate (TOPROL-XL) 25 MG 24 hr tablet TAKE 1 TABLET(25 MG) BY MOUTH DAILY 09/03/16   Thayer Headings, MD  potassium chloride SA (K-DUR,KLOR-CON) 20 MEQ tablet Take 2 tablets (40 mEq total) by mouth 2 (two) times daily. 11/23/16 11/26/16  Duffy Bruce, MD    Family  History Family History  Problem Relation Age of Onset  . Adopted: Yes    Social History Social History  Substance Use Topics  . Smoking status: Former Smoker    Packs/day: 1.50    Types: Cigarettes  . Smokeless tobacco: Never Used  . Alcohol use 0.0 oz/week     Comment: OCCASSIONALLY     Allergies   Atorvastatin and Latex   Review of Systems Review of Systems  Constitutional: Positive for fatigue. Negative for chills and fever.  HENT: Negative for congestion and rhinorrhea.   Eyes: Negative for visual disturbance.  Respiratory: Negative for cough, shortness of breath and wheezing.   Cardiovascular: Positive for palpitations. Negative for chest pain and leg swelling.  Gastrointestinal: Negative for abdominal pain, diarrhea, nausea and vomiting.  Genitourinary: Negative for dysuria and flank pain.  Musculoskeletal: Negative for neck pain and neck stiffness.  Skin: Negative for rash and wound.  Allergic/Immunologic: Negative for immunocompromised state.  Neurological: Negative for syncope, weakness and headaches.  All other systems reviewed and are negative.    Physical Exam Updated Vital Signs BP 103/63   Pulse 86   Temp 97.4 F (36.3 C) (Oral)   Resp 21   Ht 5' (1.524 m)   Wt 155 lb (70.3 kg)   SpO2 95%   BMI 30.27 kg/m   Physical Exam  Constitutional: She is  oriented to person, place, and time. She appears well-developed and well-nourished. No distress.  HENT:  Head: Normocephalic and atraumatic.  Mouth/Throat: Oropharynx is clear and moist.  Eyes: Conjunctivae are normal.  Neck: Neck supple.  Cardiovascular: Normal heart sounds.  An irregularly irregular rhythm present. Tachycardia present.  Exam reveals no friction rub.   No murmur heard. Pulmonary/Chest: Effort normal and breath sounds normal. No respiratory distress. She has no wheezes. She has no rales.  Abdominal: Soft. She exhibits no distension.  Musculoskeletal: She exhibits no edema.    Neurological: She is alert and oriented to person, place, and time. She exhibits normal muscle tone.  Skin: Skin is warm. Capillary refill takes less than 2 seconds.  Psychiatric: She has a normal mood and affect.  Nursing note and vitals reviewed.    ED Treatments / Results  Labs (all labs ordered are listed, but only abnormal results are displayed) Labs Reviewed  BASIC METABOLIC PANEL - Abnormal; Notable for the following:       Result Value   Potassium 2.9 (*)    Glucose, Bld 105 (*)    All other components within normal limits  CBC  MAGNESIUM    EKG  EKG Interpretation None       Radiology No results found.  Procedures Procedures (including critical care time)  Medications Ordered in ED Medications  flecainide (TAMBOCOR) tablet 50 mg (50 mg Oral Given 11/23/16 2124)  apixaban (ELIQUIS) tablet 5 mg (5 mg Oral Given 11/23/16 2123)  sodium chloride 0.9 % bolus 1,000 mL (0 mLs Intravenous Stopped 11/23/16 2306)  metoprolol (LOPRESSOR) injection 5 mg (5 mg Intravenous Given 11/23/16 2051)  potassium chloride SA (K-DUR,KLOR-CON) CR tablet 40 mEq (40 mEq Oral Given 11/23/16 2117)     Initial Impression / Assessment and Plan / ED Course  I have reviewed the triage vital signs and the nursing notes.  Pertinent labs & imaging results that were available during my care of the patient were reviewed by me and considered in my medical decision making (see chart for details).    61 yo F with PMHx of AFib/Flutter on flecainide/eliquis here with acute onset palpitations. No chest pain, SOB. On arrival, pt in AFib versus Aflutter with 2:1 conduction, o/w HDS with stable BP. Suspect pAFib with exacerbation 2/2 stress, dehydration. Will check labs, start IVF, give dose of IV lopressor in addition to her flecainide and eliquis, which are due at this time.  Labs show moderate hypokalemia, which has been repleted. O/w labs unremarkable. She has converted to NSR after lopressor and remains  well appearing. Discussed with Cardiology fellow, who has reviewed labs, imaging, and agrees with plan to d/c with K replacement, encourage fluids, and d/c home. Will d/c with oupt potassium, PCP f/u, and Cards f/u.  Final Clinical Impressions(s) / ED Diagnoses   Final diagnoses:  Hypokalemia  Dehydration  Paroxysmal atrial fibrillation [facility])    New Prescriptions Discharge Medication List as of 11/23/2016 11:47 PM    START taking these medications   Details  potassium chloride SA (K-DUR,KLOR-CON) 20 MEQ tablet Take 2 tablets (40 mEq total) by mouth 2 (two) times daily., Starting Fri 11/23/2016, Until Mon 11/26/2016, Print         Duffy Bruce, MD 11/24/16 [phone]

## 2016-11-23 NOTE — ED Notes (Signed)
ED Provider at bedside. 

## 2016-11-23 NOTE — ED Notes (Signed)
Dr. Issacs at bedside 

## 2016-11-23 NOTE — ED Triage Notes (Signed)
Pt presents from home with palpitations; pt reports hx of AFIB; pt denies CP, SOB; pt reports anxiousness at this time

## 2016-11-26 ENCOUNTER — Telehealth: Payer: Self-pay | Admitting: Cardiovascular Disease

## 2016-11-26 NOTE — Telephone Encounter (Signed)
New Message   Pt calling stating she was in the ER on 11/23/16. She is calling to follow up per discharge summary. Pt does not know which medication needs to be changed.   Contact: Call your cardiologist to discuss your recent ED visit and possible medication changes

## 2016-11-26 NOTE — Telephone Encounter (Signed)
Reports being under a lot of stress recently, states "she is going a lot and not paying attention to taking in enough fluids".  Also reports recent "stomach bug" -- did not experience vomiting w/ virus, only diarrhea.  She was home Friday evening when she experienced increased HRs and called and spoke with after hours NP (see 2/2 telephone note), who advised pt to go to E.D. for evaluation.  In ED she was noted to be dehydrated and hypokalemic. She was sent home w/ several days of potassium supplementation and is asking if she needs to continue with this.  Advised pt to finish up what was prescribed and to increase her daily intake of high potassium foods after she finishes rx.  She was also encouraged to keep hydrated and pt tells me she is making an effort to drink a lot of water and some juices.  Appt made for 2/14 w/ an extender for follow up post hospital visit and to draw BMET to see how her potassium level is maintaining. Advised to call office if HR is elevated again and/or symptoms reoccur.  Patient verbalized understanding and agreeable to plan.

## 2016-11-27 ENCOUNTER — Telehealth: Payer: Self-pay | Admitting: Cardiovascular Disease

## 2016-11-27 NOTE — Telephone Encounter (Signed)
Spoke with patient who was in ED over the weekend and called yesterday with questions about potassium. She called today to ask if she can take a low dose of xanax to help with her anxiety. She states she is very nervous about going back into atrial fib and having problems due to low K+. She states she is making certain she is better hydrated and has increased her intake of dietary K+. She has a follow-up appointment next week. She verbalized understanding and agreement with plan.

## 2016-11-27 NOTE — Telephone Encounter (Signed)
Mrs. Talley is calling because she has a question about taking Xanax. Please call

## 2016-11-29 ENCOUNTER — Encounter: Payer: Self-pay | Admitting: Physician Assistant

## 2016-12-03 NOTE — Progress Notes (Signed)
Cardiology Office Note    Date:  12/05/2016   ID:  Wendy Brown, DOB 08-21-1956, MRN [id number]  PCP:  REDMON,NOELLE, PA-C  Cardiologist:  Dr. Acie Fredrickson  Chief Complaint: Palpitations  History of Present Illness:   Wendy Brown is a 61 y.o. female PMHx of AFib on flecainide/eliquis presents for palpiations.  Seen in ER 11/23/16 for several days hx of palpitations. Under lots of stress recently. Felt fast and irregular rate. Pulse of 130s at home. Noted in aflutter. Converted to NSR after lopressor. Also treated from K of 2.9 and dehydration.  Today presents for follow up. Had a history of diarrhea and feeling dizziness prior to ER visit. Since ER visits she has been drinking 3-4 cans of 8 ounces of V8. Eating banana. No further dizziness, chest pain, palpitation, syncope, PND, lower extremity edema, melena or blood in her stool or urine. HR in70-90s at home. With ambulation it goes upto 110s but trends down with rest.   Past Medical History:  Diagnosis Date  . A-fib ([place])   . Cataract     Past Surgical History:  Procedure Laterality Date  . ABDOMINAL HYSTERECTOMY    . CERVICAL DISC ARTHROPLASTY     NECK  . RIB FRACTURE SURGERY      Current Medications: Prior to Admission medications   Medication Sig Start Date End Date Taking? Authorizing Provider  apixaban (ELIQUIS) 5 MG TABS tablet TAKE 1 TABLET(5 MG) BY MOUTH TWICE DAILY 09/03/16   Thayer Headings, MD  flecainide (TAMBOCOR) 50 MG tablet Take 1 tablet (50 mg total) by mouth 2 (two) times daily. 06/01/16   Thayer Headings, MD  metoprolol succinate (TOPROL-XL) 25 MG 24 hr tablet TAKE 1 TABLET(25 MG) BY MOUTH DAILY 09/03/16   Thayer Headings, MD  potassium chloride SA (K-DUR,KLOR-CON) 20 MEQ tablet Take 2 tablets (40 mEq total) by mouth 2 (two) times daily. 11/23/16 11/26/16  Duffy Bruce, MD    Allergies:   Atorvastatin and Latex   Social History   Social History  . Marital status: Single    Spouse name: N/A  .  Number of children: 4  . Years of education: 12   Occupational History  . UNEMPLOYED    Social History Main Topics  . Smoking status: Former Smoker    Packs/day: 1.50    Types: Cigarettes  . Smokeless tobacco: Never Used  . Alcohol use 0.0 oz/week     Comment: OCCASSIONALLY  . Drug use: No  . Sexual activity: Not Asked   Other Topics Concern  . None   Social History Narrative  . None     Family History:  The patient's family history is not on file. She was adopted.   ROS:   Please see the history of present illness.    ROS All other systems reviewed and are negative.   PHYSICAL EXAM:   VS:  BP 110/70   Pulse 72   Ht 5' (1.524 m)   Wt 158 lb 12.8 oz (72 kg)   BMI 31.01 kg/m    GEN: Well nourished, well developed, in no acute distress  HEENT: normal  Neck: no JVD, carotid bruits, or masses Cardiac: RRR; no murmurs, rubs, or gallops,no edema  Respiratory:  clear to auscultation bilaterally, normal work of breathing GI: soft, nontender, nondistended, + BS MS: no deformity or atrophy  Skin: warm and dry, no rash Neuro:  Alert and Oriented x 3, Strength and sensation are intact Psych: euthymic mood,  full affect  Wt Readings from Last 3 Encounters:  12/05/16 158 lb 12.8 oz (72 kg)  11/23/16 155 lb (70.3 kg)  09/03/16 152 lb 12.8 oz (69.3 kg)      Studies/Labs Reviewed:   EKG:  EKG is not ordered today.   Recent Labs: 06/28/2016: ALT 15 11/23/2016: BUN 13; Creatinine, Ser 0.97; Hemoglobin 14.8; Magnesium 2.1; Platelets 289; Potassium 2.9; Sodium 140   Lipid Panel    Component Value Date/Time   CHOL 160 06/28/2016 0802   TRIG 65 06/28/2016 0802   HDL 61 06/28/2016 0802   CHOLHDL 2.6 06/28/2016 0802   VLDL 13 06/28/2016 0802   LDLCALC 86 06/28/2016 0802    Additional studies/ records that were reviewed today include:   Echocardiogram: 09/09/15 Study Conclusions  - Left ventricle: The cavity size was normal. Wall thickness was   normal. Systolic  function was normal. The estimated ejection   fraction was in the range of 50% to 55%. - Mitral valve: Mild prolapse of the anterior mitral leaflet There   was mild regurgitation. - Left atrium: The atrium was mildly dilated.   Myoivew 06/15/16   Nuclear stress EF: 64%.  There was no ST segment deviation noted during stress.  No T wave inversion was noted during stress.  The study is normal.  This is a low risk study.   Low risk stress nuclear study with normal perfusion and normal left ventricular regional and global systolic function. Mild breast attenuation artifact is seen.    ASSESSMENT & PLAN:    1. Proxysmal Afib/alutter - Atrial flutter in setting of hypokalemia, dehydration (due to diarrhea) and stress. CHADSVASCs score of 1 (female). Continue flecianide, Eliquis and Toprol XL. No recent t symptoms since drinking V8 and fluids. No syncope.  Check BMET and TSH today. If recurrent symptoms,  She will let us know. Consider monitor and echo at that time.     2. Hypokalemia - Check BMET today.   Medication Adjustments/Labs and Tests Ordered: Current medicines are reviewed at length with the patient today.  Concerns regarding medicines are outlined above.  Medication changes, Labs and Tests ordered today are listed in the Patient Instructions below. Patient Instructions  Your physician recommends that you continue on your current medications as directed. Please refer to the Current Medication list given to you today.  Your physician recommends that you return for lab work in:  BMET AND  TSH  Your physician recommends that you schedule a follow-up appointment in: [address]  Burnis Kingfisher, PA  12/05/2016 1:49 PM    [address], [place], [place]  [address] Phone: [phone]; Fax: [phone]

## 2016-12-05 ENCOUNTER — Encounter: Payer: Self-pay | Admitting: Physician Assistant

## 2016-12-05 ENCOUNTER — Ambulatory Visit (INDEPENDENT_AMBULATORY_CARE_PROVIDER_SITE_OTHER): Payer: BLUE CROSS/BLUE SHIELD | Admitting: Physician Assistant

## 2016-12-05 VITALS — BP 110/70 | HR 72 | Ht 60.0 in | Wt 158.8 lb

## 2016-12-05 DIAGNOSIS — I4892 Unspecified atrial flutter: Secondary | ICD-10-CM | POA: Diagnosis not present

## 2016-12-05 DIAGNOSIS — M5412 Radiculopathy, cervical region: Secondary | ICD-10-CM | POA: Diagnosis not present

## 2016-12-05 DIAGNOSIS — I480 Paroxysmal atrial fibrillation: Secondary | ICD-10-CM

## 2016-12-05 DIAGNOSIS — M5414 Radiculopathy, thoracic region: Secondary | ICD-10-CM | POA: Diagnosis not present

## 2016-12-05 DIAGNOSIS — E876 Hypokalemia: Secondary | ICD-10-CM | POA: Diagnosis not present

## 2016-12-05 NOTE — Patient Instructions (Addendum)
Your physician recommends that you continue on your current medications as directed. Please refer to the Current Medication list given to you today.  Your physician recommends that you return for lab work in:  BMET AND  TSH  Your physician recommends that you schedule a follow-up appointment in: [place]  Acie Fredrickson

## 2016-12-06 LAB — BASIC METABOLIC PANEL
BUN/Creatinine Ratio: 13 (ref 12–28)
BUN: 9 mg/dL (ref 8–27)
CHLORIDE: 99 mmol/L (ref 96–106)
CO2: 20 mmol/L (ref 18–29)
Calcium: 9.9 mg/dL (ref 8.7–10.3)
Creatinine, Ser: 0.68 mg/dL (ref 0.57–1.00)
GFR, EST AFRICAN AMERICAN: 109 mL/min/{1.73_m2} (ref >59)
GFR, EST NON AFRICAN AMERICAN: 95 mL/min/{1.73_m2} (ref >59)
Glucose: 92 mg/dL (ref 65–99)
POTASSIUM: 5.1 mmol/L (ref 3.5–5.2)
Sodium: 142 mmol/L (ref 134–144)

## 2016-12-06 LAB — TSH: TSH: 0.455 u[IU]/mL (ref 0.450–4.500)

## 2016-12-15 ENCOUNTER — Telehealth: Payer: Self-pay | Admitting: Cardiovascular Disease

## 2016-12-15 NOTE — Telephone Encounter (Signed)
Pt had interrmittent runs of Afib throughout the day, lasting for a few minutes at a time before self resolving.  She is currently in NSR and denies CP or SOB during the paroxysms.  She asked if it would be okay to take an additional 1/2 tablet of her prescribed metoprolol XL if episodes last for a longer period of time and I agreed that this was reasonable.  Clayborne Dana MD

## 2016-12-18 ENCOUNTER — Telehealth: Payer: Self-pay | Admitting: Cardiovascular Disease

## 2016-12-18 NOTE — Telephone Encounter (Signed)
New Message:    Pt says she have had a couple episodes where her heart rate is going up. Saturday night it was 136 and 140.This morning it was up to 130 something.She wants to know what to do or does she need to be seen? At this tim is it 124.

## 2016-12-18 NOTE — Telephone Encounter (Signed)
Patient calling complaining of episodes of palpitations. She states that on Saturday night her HR was in the 130s and she took 1/2 of the xanax that her PCP prescribed her and it resolved on it's own. She states that this morning she had another episode with the HR in the 120s. She states that she took another 1/2 of the xanax and took an extra dose of the toprol Xl. She states that with time her HR does go back to normal. The patient states that her BP has been 125/80 and 105/78 during these episodes. The patient has been drinking plenty of fluids and has not had any diarrhea. She denies any other symptoms. The patient states that she has been taking her meds as directed. The patient states that she has been very anxious about her upcoming MRI of her neck and she can feel herself getting worked up. I advised the patient that it sounded like it was anxiety related. I advised the patient to take her xanax as directed and discussed relaxation techniques. Patient verbalized understanding and is in agreement with this plan.

## 2016-12-19 DIAGNOSIS — F411 Generalized anxiety disorder: Secondary | ICD-10-CM | POA: Diagnosis not present

## 2016-12-20 DIAGNOSIS — M5124 Other intervertebral disc displacement, thoracic region: Secondary | ICD-10-CM | POA: Diagnosis not present

## 2016-12-20 DIAGNOSIS — M542 Cervicalgia: Secondary | ICD-10-CM | POA: Diagnosis not present

## 2016-12-20 DIAGNOSIS — I10 Essential (primary) hypertension: Secondary | ICD-10-CM | POA: Diagnosis not present

## 2016-12-20 DIAGNOSIS — Z6830 Body mass index (BMI) 30.0-30.9, adult: Secondary | ICD-10-CM | POA: Diagnosis not present

## 2016-12-20 DIAGNOSIS — M5414 Radiculopathy, thoracic region: Secondary | ICD-10-CM | POA: Diagnosis not present

## 2016-12-20 DIAGNOSIS — M5412 Radiculopathy, cervical region: Secondary | ICD-10-CM | POA: Diagnosis not present

## 2016-12-27 ENCOUNTER — Telehealth: Payer: Self-pay | Admitting: Cardiovascular Disease

## 2016-12-27 NOTE — Telephone Encounter (Signed)
New Message  Pt voiced family pcp placed pt on lexapro.  Please f/u

## 2016-12-27 NOTE — Telephone Encounter (Signed)
Agree with the note in the plan as outlined by Christen Bame, RN

## 2016-12-27 NOTE — Telephone Encounter (Signed)
Spoke with patient who states Dr. Barrie Folk put her on Lexapro 2 weeks ago. She states it has helped her a great deal and makes her feel calmer. I spoke with Eino Farber, RPh and she advised that a medication in the SNRI class, like Duloxetine, would be less likely to cause QTC elongation with flecainide.  I advised patient to call Dr. Barrie Folk back to discuss. I have scheduled her for an EKG on Wed. 3/14. Patient thanked me for the call.

## 2017-01-02 ENCOUNTER — Encounter: Payer: Self-pay | Admitting: Nurse Practitioner

## 2017-01-02 ENCOUNTER — Ambulatory Visit (INDEPENDENT_AMBULATORY_CARE_PROVIDER_SITE_OTHER): Payer: BLUE CROSS/BLUE SHIELD | Admitting: Nurse Practitioner

## 2017-01-02 VITALS — BP 98/70 | HR 72 | Ht 61.0 in | Wt 152.0 lb

## 2017-01-02 DIAGNOSIS — I482 Chronic atrial fibrillation, unspecified: Secondary | ICD-10-CM

## 2017-01-02 LAB — BASIC METABOLIC PANEL
BUN / CREAT RATIO: 18 (ref 12–28)
BUN: 12 mg/dL (ref 8–27)
CO2: 23 mmol/L (ref 18–29)
CREATININE: 0.68 mg/dL (ref 0.57–1.00)
Calcium: 9.8 mg/dL (ref 8.7–10.3)
Chloride: 99 mmol/L (ref 96–106)
GFR, EST AFRICAN AMERICAN: 109 mL/min/{1.73_m2} (ref >59)
GFR, EST NON AFRICAN AMERICAN: 95 mL/min/{1.73_m2} (ref >59)
Glucose: 106 mg/dL — ABNORMAL HIGH (ref 65–99)
POTASSIUM: 4.1 mmol/L (ref 3.5–5.2)
SODIUM: 138 mmol/L (ref 134–144)

## 2017-01-02 NOTE — Progress Notes (Signed)
1.) Reason for visit: follow-up for patient on Flecainide and started on Lexapro by PCP  2.) Name of MD requesting visit: Dr. Acie Fredrickson  3.) H&P: Patient presents today for EKG. She was started on Lexapro by her PCP 2-3 weeks ago. I spoke with her last week and advised that per Eino Farber, PharmD she should ask PCP to switch her to an anti-depressant in a different class that will interfere less with flecainide. Patient was changed to Effexor and took first dose a few days ago  4.) ROS related to problem: Patient asks about BP spikes with Effexor. Admits that checking her BP increases her anxiety which further increases HR and BP. I advised her to monitor only if she feels that it is too high or low and to continue Effexor to allow it time to get into her system  5.) Assessment and plan per MD: EKG WNL per Dr. Acie Fredrickson, continue current medications. I have scheduled her to see Dr. Acie Fredrickson in approximately 4 weeks per her request to see him sooner than previously scheduled 3 mo f/u

## 2017-01-02 NOTE — Patient Instructions (Addendum)
Medication Instructions:  Your physician recommends that you continue on your current medications as directed. Please refer to the Current Medication list given to you today.   Labwork: TODAY - basic metabolic panel   Testing/Procedures: None Ordered   Follow-Up: Your physician recommends that you return for follow-up appointment on Thursday April 19 at 2:20 pm   If you need a refill on your cardiac medications before your next appointment, please call your pharmacy.   Thank you for choosing [facility]! Christen Bame, RN [phone]

## 2017-01-28 DIAGNOSIS — Z6829 Body mass index (BMI) 29.0-29.9, adult: Secondary | ICD-10-CM | POA: Diagnosis not present

## 2017-01-28 DIAGNOSIS — M5412 Radiculopathy, cervical region: Secondary | ICD-10-CM | POA: Diagnosis not present

## 2017-01-28 DIAGNOSIS — M791 Myalgia: Secondary | ICD-10-CM | POA: Diagnosis not present

## 2017-01-28 DIAGNOSIS — M9981 Other biomechanical lesions of cervical region: Secondary | ICD-10-CM | POA: Diagnosis not present

## 2017-02-06 DIAGNOSIS — M4722 Other spondylosis with radiculopathy, cervical region: Secondary | ICD-10-CM | POA: Diagnosis not present

## 2017-02-06 DIAGNOSIS — M5412 Radiculopathy, cervical region: Secondary | ICD-10-CM | POA: Diagnosis not present

## 2017-02-07 ENCOUNTER — Ambulatory Visit (INDEPENDENT_AMBULATORY_CARE_PROVIDER_SITE_OTHER): Payer: BLUE CROSS/BLUE SHIELD | Admitting: Cardiovascular Disease

## 2017-02-07 ENCOUNTER — Encounter: Payer: Self-pay | Admitting: Cardiovascular Disease

## 2017-02-07 VITALS — BP 118/62 | HR 71 | Ht 61.0 in | Wt 150.8 lb

## 2017-02-07 DIAGNOSIS — I480 Paroxysmal atrial fibrillation: Secondary | ICD-10-CM | POA: Diagnosis not present

## 2017-02-07 NOTE — Patient Instructions (Signed)
Medication Instructions:  Your physician recommends that you continue on your current medications as directed. Please refer to the Current Medication list given to you today.   Labwork: Your physician recommends that you return for lab work (CBC, BMET) in: 6 months on the day of or a few days before your office visit with Dr. Acie Fredrickson.  You will need to FAST for this appointment - nothing to eat or drink after midnight the night before except water.   Testing/Procedures: None Ordered   Follow-Up: Your physician wants you to follow-up in: 6 months with Dr. Acie Fredrickson.  You will receive a reminder letter in the mail two months in advance. If you don't receive a letter, please call our office to schedule the follow-up appointment.   If you need a refill on your cardiac medications before your next appointment, please call your pharmacy.   Thank you for choosing [facility]! Christen Bame, RN [phone]

## 2017-02-07 NOTE — Progress Notes (Signed)
Cardiology Office Note   Date:  02/07/2017   ID:  Wendy Brown, DOB August 31, 1956, MRN [id number]  PCP:  REDMON,NOELLE, PA-C  Cardiologist:   Mertie Moores, MD   Chief Complaint  Patient presents with  . Atrial Fibrillation   1. Problem list 1. Atrial fibrillation   History of Present Illness: Wendy Brown is a 61 y.o. female who presents for new onset / new diagnosis of atrial fib.  Seen with daughter, Wendy Brown .   Was to have cataract surgery this am.  Was noticed to have rapid atrial fib.  Was sent to her primary care doctor who called [place] for advice.   She denies any chest pain or shortness of breath. She really cannot tell that her heart rate is regular. She knows that she's had palpitations over the past years   Has seen Dr. Olevia Perches and had atrial fib about 6 years ( found after she fell through the attic floor)  Does not work - takes care of her disabled son.  No CP , no dyspnea.   Can have DOE walking up stairs.   Has lots of MSK pain related to her fall in 2010 - sleeps in a recliner   Dec. 9, 2016:  [name] was admitted to the hospital with rapid atrial fib.  Was started on amiodarone. Has converted to NSR ,  OnEliuqis.  Feeling better.  Has cut her coffee intake.  Eating lot fat meat.   Doing well.  No additional episodes of Afib.   Getting some exercise.    Was a bit fatigued immediately after she left the hospital because of all the new medications. Has been using Vaping cigarettes.    March 28, 2016:  Doing well. Still in NSR , on amio 200 a day   Nov. 13, 2017:  Jazelle is seen today for her PAF Had cortisone injections and had heart palpitations as a side effect.  PAF seems to be better controlled on the flecainide  Not exercising much.    Has some neck and back issues.   Has quit smoking   February 07, 2017:  Wendy Brown is seen today for follow up of her PAF Is eating better  Is not smoking .  Using a fit bit.    Past Medical History:    Diagnosis Date  . A-fib ([place])   . Cataract     Past Surgical History:  Procedure Laterality Date  . ABDOMINAL HYSTERECTOMY    . CERVICAL DISC ARTHROPLASTY     NECK  . RIB FRACTURE SURGERY       Current Outpatient Prescriptions  Medication Sig Dispense Refill  . ALPRAZolam (XANAX) 0.25 MG tablet TK 1 T PO QD PRN  0  . apixaban (ELIQUIS) 5 MG TABS tablet TAKE 1 TABLET(5 MG) BY MOUTH TWICE DAILY 60 tablet 11  . flecainide (TAMBOCOR) 50 MG tablet Take 1 tablet (50 mg total) by mouth 2 (two) times daily. 60 tablet 11  . metoprolol succinate (TOPROL-XL) 25 MG 24 hr tablet TAKE 1 TABLET(25 MG) BY MOUTH DAILY 30 tablet 11  . venlafaxine XR (EFFEXOR XR) 37.5 MG 24 hr capsule Take 1 tablet by mouth daily.     No current facility-administered medications for this visit.     Allergies:   Atorvastatin and Latex    Social History:  The patient  reports that she has quit smoking. Her smoking use included Cigarettes. She smoked 1.50 packs per day. She has never used smokeless tobacco.  She reports that she drinks alcohol. She reports that she does not use drugs.   Family History:  The patient's She was adopted. Family history is unknown by patient.    ROS:  Please see the history of present illness.    Review of Systems: Constitutional:  denies fever, chills, diaphoresis, appetite change and fatigue.  HEENT: denies photophobia, eye pain, redness, hearing loss, ear pain, congestion, sore throat, rhinorrhea, sneezing, neck pain, neck stiffness and tinnitus.  Respiratory: denies SOB, DOE, cough, chest tightness, and wheezing.  Cardiovascular: denies chest pain, palpitations and leg swelling.  Gastrointestinal: denies nausea, vomiting, abdominal pain, diarrhea, constipation, blood in stool.  Genitourinary: denies dysuria, urgency, frequency, hematuria, flank pain and difficulty urinating.  Musculoskeletal: denies  myalgias, back pain, joint swelling, arthralgias and gait problem.   Skin:  denies pallor, rash and wound.  Neurological: denies dizziness, seizures, syncope, weakness, light-headedness, numbness and headaches.   Hematological: denies adenopathy, easy bruising, personal or family bleeding history.  Psychiatric/ Behavioral: denies suicidal ideation, mood changes, confusion, nervousness, sleep disturbance and agitation.       All other systems are reviewed and negative.    PHYSICAL EXAM: VS:  BP 118/62   Pulse 71   Ht 5\' 1"  (1.549 m)   Wt 150 lb 12.8 oz (68.4 kg)   BMI 28.49 kg/m  , BMI Body mass index is 28.49 kg/m. GEN: Well nourished, well developed, in no acute distress  HEENT: normal  Neck: no JVD, carotid bruits, or masses Cardiac: RR, soft systolic murmur, no rubs, or gallops,no edema  Respiratory:  clear to auscultation bilaterally, normal work of breathing GI: soft, nontender, nondistended, + BS MS: no deformity or atrophy  Skin: warm and dry, no rash Neuro:  Strength and sensation are intact Psych: normal   EKG:  EKG is ordered today. Sinus rhythm at 71 with PACs.     Recent Labs: 06/28/2016: ALT 15 11/23/2016: Hemoglobin 14.8; Magnesium 2.1; Platelets 289 12/05/2016: TSH 0.455 01/02/2017: BUN 12; Creatinine, Ser 0.68; Potassium 4.1; Sodium 138    Lipid Panel    Component Value Date/Time   CHOL 160 06/28/2016 0802   TRIG 65 06/28/2016 0802   HDL 61 06/28/2016 0802   CHOLHDL 2.6 06/28/2016 0802   VLDL 13 06/28/2016 0802   LDLCALC 86 06/28/2016 0802      Wt Readings from Last 3 Encounters:  02/07/17 150 lb 12.8 oz (68.4 kg)  01/02/17 152 lb (68.9 kg)  12/05/16 158 lb 12.8 oz (72 kg)      Other studies Reviewed: Additional studies/ records that were reviewed today include: . Review of the above records demonstrates:    ASSESSMENT AND PLAN:  1.  Atrial fibrillation with rapid ventricular response.  CHADS2VASC score of 1 ( female )  Has converted back to sinus rhythm .  Staying in NSR on Flecainide.  Has lots of stress  related issues.     2. Anxiety :   Has lots of anxiety issues.  Will defer to her primary MD   3. Back pain :   Getting back injections   She is moving to [facility] ( near [facility])    Mertie Moores, MD  02/07/2017 2:28 PM    [address], [place], [place]  [address] Phone: [phone]; Fax: [phone]

## 2017-02-08 DIAGNOSIS — F411 Generalized anxiety disorder: Secondary | ICD-10-CM | POA: Diagnosis not present

## 2017-03-06 DIAGNOSIS — T7840XA Allergy, unspecified, initial encounter: Secondary | ICD-10-CM | POA: Diagnosis not present

## 2017-03-11 ENCOUNTER — Ambulatory Visit: Payer: BLUE CROSS/BLUE SHIELD | Admitting: Cardiovascular Disease

## 2017-03-11 DIAGNOSIS — H1045 Other chronic allergic conjunctivitis: Secondary | ICD-10-CM | POA: Diagnosis not present

## 2017-03-11 DIAGNOSIS — H04123 Dry eye syndrome of bilateral lacrimal glands: Secondary | ICD-10-CM | POA: Diagnosis not present

## 2017-03-11 DIAGNOSIS — H18413 Arcus senilis, bilateral: Secondary | ICD-10-CM | POA: Diagnosis not present

## 2017-03-11 DIAGNOSIS — H11423 Conjunctival edema, bilateral: Secondary | ICD-10-CM | POA: Diagnosis not present

## 2017-03-12 ENCOUNTER — Telehealth: Payer: Self-pay | Admitting: Cardiology

## 2017-03-12 NOTE — Telephone Encounter (Signed)
Received page from pt that she missed her AM Eliquis, metoprolol and flecainide.  She took doses of all 3 this evening.  She has been asymptomatic and without any cardiac complaints.  I advised her to take all of her medications as scheduled tomorrow.

## 2017-03-14 DIAGNOSIS — Z1231 Encounter for screening mammogram for malignant neoplasm of breast: Secondary | ICD-10-CM | POA: Diagnosis not present

## 2017-05-03 DIAGNOSIS — M542 Cervicalgia: Secondary | ICD-10-CM | POA: Diagnosis not present

## 2017-05-03 DIAGNOSIS — M25561 Pain in right knee: Secondary | ICD-10-CM | POA: Diagnosis not present

## 2017-05-03 DIAGNOSIS — M5412 Radiculopathy, cervical region: Secondary | ICD-10-CM | POA: Diagnosis not present

## 2017-05-03 DIAGNOSIS — M4722 Other spondylosis with radiculopathy, cervical region: Secondary | ICD-10-CM | POA: Diagnosis not present

## 2017-05-05 ENCOUNTER — Other Ambulatory Visit: Payer: Self-pay | Admitting: Cardiovascular Disease

## 2017-06-04 DIAGNOSIS — R05 Cough: Secondary | ICD-10-CM | POA: Diagnosis not present

## 2017-07-17 ENCOUNTER — Encounter: Payer: Self-pay | Admitting: Cardiovascular Disease

## 2017-07-24 ENCOUNTER — Other Ambulatory Visit: Payer: BLUE CROSS/BLUE SHIELD

## 2017-07-24 DIAGNOSIS — M25561 Pain in right knee: Secondary | ICD-10-CM | POA: Diagnosis not present

## 2017-07-24 DIAGNOSIS — I480 Paroxysmal atrial fibrillation: Secondary | ICD-10-CM | POA: Diagnosis not present

## 2017-07-24 DIAGNOSIS — M5412 Radiculopathy, cervical region: Secondary | ICD-10-CM | POA: Diagnosis not present

## 2017-07-24 DIAGNOSIS — M4722 Other spondylosis with radiculopathy, cervical region: Secondary | ICD-10-CM | POA: Diagnosis not present

## 2017-07-24 DIAGNOSIS — M542 Cervicalgia: Secondary | ICD-10-CM | POA: Diagnosis not present

## 2017-07-24 DIAGNOSIS — M2241 Chondromalacia patellae, right knee: Secondary | ICD-10-CM | POA: Diagnosis not present

## 2017-07-25 ENCOUNTER — Telehealth: Payer: Self-pay | Admitting: Cardiovascular Disease

## 2017-07-25 ENCOUNTER — Encounter: Payer: Self-pay | Admitting: Cardiovascular Disease

## 2017-07-25 ENCOUNTER — Ambulatory Visit (INDEPENDENT_AMBULATORY_CARE_PROVIDER_SITE_OTHER): Payer: BLUE CROSS/BLUE SHIELD | Admitting: Cardiovascular Disease

## 2017-07-25 ENCOUNTER — Other Ambulatory Visit: Payer: BLUE CROSS/BLUE SHIELD

## 2017-07-25 VITALS — BP 110/70 | HR 79 | Ht 61.0 in | Wt 145.8 lb

## 2017-07-25 DIAGNOSIS — I480 Paroxysmal atrial fibrillation: Secondary | ICD-10-CM

## 2017-07-25 LAB — BASIC METABOLIC PANEL
BUN / CREAT RATIO: 21 (ref 12–28)
BUN: 11 mg/dL (ref 8–27)
CO2: 24 mmol/L (ref 20–29)
CREATININE: 0.53 mg/dL — AB (ref 0.57–1.00)
Calcium: 9.5 mg/dL (ref 8.7–10.3)
Chloride: 102 mmol/L (ref 96–106)
GFR calc Af Amer: 118 mL/min/{1.73_m2} (ref >59)
GFR, EST NON AFRICAN AMERICAN: 102 mL/min/{1.73_m2} (ref >59)
GLUCOSE: 83 mg/dL (ref 65–99)
Potassium: 4 mmol/L (ref 3.5–5.2)
Sodium: 139 mmol/L (ref 134–144)

## 2017-07-25 LAB — CBC
HEMATOCRIT: 45.1 % (ref 34.0–46.6)
HEMOGLOBIN: 15.4 g/dL (ref 11.1–15.9)
MCH: 33.3 pg — AB (ref 26.6–33.0)
MCHC: 34.1 g/dL (ref 31.5–35.7)
MCV: 98 fL — AB (ref 79–97)
Platelets: 259 10*3/uL (ref 150–379)
RBC: 4.62 x10E6/uL (ref 3.77–5.28)
RDW: 15.7 % — ABNORMAL HIGH (ref 12.3–15.4)
WBC: 6.1 10*3/uL (ref 3.4–10.8)

## 2017-07-25 NOTE — Progress Notes (Signed)
Cardiology Office Note   Date:  07/25/2017   ID:  Iyanni, Hepp 11-20-1955, MRN [id number]  PCP:  Lennie Odor, PA-C  Cardiologist:   Mertie Moores, MD   Chief Complaint  Patient presents with  . Follow-up    PAF    1. Problem list 1. Atrial fibrillation   History of Present Illness: ANUSHA CLAUS is a 61 y.o. female who presents for new onset / new diagnosis of atrial fib.  Seen with daughter, [name] .   Was to have cataract surgery this am.  Was noticed to have rapid atrial fib.  Was sent to her primary care doctor who called [place] for advice.   She denies any chest pain or shortness of breath. She really cannot tell that her heart rate is regular. She knows that she's had palpitations over the past years   Has seen Dr. Olevia Perches and had atrial fib about 6 years ( found after she fell through the attic floor)  Does not work - takes care of her disabled son.  No CP , no dyspnea.   Can have DOE walking up stairs.   Has lots of MSK pain related to her fall in 2010 - sleeps in a recliner   Dec. 9, 2016:  Nima was admitted to the hospital with rapid atrial fib.  Was started on amiodarone. Has converted to NSR ,  OnEliuqis.  Feeling better.  Has cut her coffee intake.  Eating lot fat meat.   Doing well.  No additional episodes of Afib.   Getting some exercise.    Was a bit fatigued immediately after she left the hospital because of all the new medications. Has been using Vaping cigarettes.    March 28, 2016:  Doing well. Still in NSR , on amio 200 a day   Nov. 13, 2017:  Arriyah is seen today for her PAF Had cortisone injections and had heart palpitations as a side effect.  PAF seems to be better controlled on the flecainide  Not exercising much.    Has some neck and back issues.   Has quit smoking   February 07, 2017:  Adrean is seen today for follow up of her PAF Is eating better  Is not smoking .  Using a fit bit.    Oct. 4, 2018:  Sophi is seen  today  Moved out to [facility] Has not had a kitchen for a while.    Is remodling the whole house. Has been under lots of stress. But she feels a special calm when she is is in the mountains.   Has not had any significant palpitations.    Is not exercing much .  [place]   Past Medical History:  Diagnosis Date  . A-fib ([place])   . Cataract     Past Surgical History:  Procedure Laterality Date  . ABDOMINAL HYSTERECTOMY    . CERVICAL DISC ARTHROPLASTY     NECK  . RIB FRACTURE SURGERY       Current Outpatient Prescriptions  Medication Sig Dispense Refill  . ALPRAZolam (XANAX) 0.25 MG tablet Take 1 tablet by mouth daily as needed for anxiety  0  . apixaban (ELIQUIS) 5 MG TABS tablet TAKE 1 TABLET(5 MG) BY MOUTH TWICE DAILY 60 tablet 11  . flecainide (TAMBOCOR) 50 MG tablet TAKE 1 TABLET(50 MG) BY MOUTH TWICE DAILY 60 tablet 8  . lidocaine (LIDODERM) 5 % Place 1 patch onto the skin as  directed. Apply 1 patch to knee for 12 hours, then remove for 12 hours  0  . metoprolol succinate (TOPROL-XL) 25 MG 24 hr tablet TAKE 1 TABLET(25 MG) BY MOUTH DAILY 30 tablet 11  . venlafaxine XR (EFFEXOR XR) 37.5 MG 24 hr capsule Take 1 tablet by mouth daily.     No current facility-administered medications for this visit.     Allergies:   Atorvastatin and Latex    Social History:  The patient  reports that she has quit smoking. Her smoking use included Cigarettes. She smoked 1.50 packs per day. She has never used smokeless tobacco. She reports that she drinks alcohol. She reports that she does not use drugs.   Family History:  The patient's She was adopted. Family history is unknown by patient.    ROS:  Please see the history of present illness.    Review of Systems: Constitutional:  denies fever, chills, diaphoresis, appetite change and fatigue.  HEENT: denies photophobia, eye pain, redness, hearing loss, ear pain, congestion, sore throat, rhinorrhea, sneezing, neck  pain, neck stiffness and tinnitus.  Respiratory: denies SOB, DOE, cough, chest tightness, and wheezing.  Cardiovascular: denies chest pain, palpitations and leg swelling.  Gastrointestinal: denies nausea, vomiting, abdominal pain, diarrhea, constipation, blood in stool.  Genitourinary: denies dysuria, urgency, frequency, hematuria, flank pain and difficulty urinating.  Musculoskeletal: denies  myalgias, back pain, joint swelling, arthralgias and gait problem.   Skin: denies pallor, rash and wound.  Neurological: denies dizziness, seizures, syncope, weakness, light-headedness, numbness and headaches.   Hematological: denies adenopathy, easy bruising, personal or family bleeding history.  Psychiatric/ Behavioral: denies suicidal ideation, mood changes, confusion, nervousness, sleep disturbance and agitation.       All other systems are reviewed and negative.   Physical Exam: Blood pressure 110/70, pulse 79, height 5\' 1"  (1.549 m), weight 145 lb 12.8 oz (66.1 kg).  GEN:  Well nourished, well developed in no acute distress HEENT: Normal NECK: No JVD; No carotid bruits LYMPHATICS: No lymphadenopathy CARDIAC: RR, no murmurs, rubs, gallops RESPIRATORY:  Clear to auscultation without rales, wheezing or rhonchi  ABDOMEN: Soft, non-tender, non-distended MUSCULOSKELETAL:  No edema; No deformity  SKIN: Warm and dry NEUROLOGIC:  Alert and oriented x 3   EKG:  EKG is not  ordered today.      Recent Labs: 11/23/2016: Magnesium 2.1 12/05/2016: TSH 0.455 07/24/2017: BUN 11; Creatinine, Ser 0.53; Hemoglobin 15.4; Platelets 259; Potassium 4.0; Sodium 139    Lipid Panel    Component Value Date/Time   CHOL 160 06/28/2016 0802   TRIG 65 06/28/2016 0802   HDL 61 06/28/2016 0802   CHOLHDL 2.6 06/28/2016 0802   VLDL 13 06/28/2016 0802   LDLCALC 86 06/28/2016 0802      Wt Readings from Last 3 Encounters:  07/25/17 145 lb 12.8 oz (66.1 kg)  02/07/17 150 lb 12.8 oz (68.4 kg)  01/02/17 152 lb  (68.9 kg)      Other studies Reviewed: Additional studies/ records that were reviewed today include: . Review of the above records demonstrates:    ASSESSMENT AND PLAN:  1.  Atrial fibrillation with rapid ventricular response.   .   Doing well  Staying in NSR .   Will get an ECG in a year   2. Anxiety :   Still a challenge    3. Back pain :      She is moving to [facility] ( near [facility])    Mertie Moores, MD  07/25/2017  11:08 AM    [facility] [address], [place], [place]  [address] Phone: [phone]; Fax: [phone]

## 2017-07-25 NOTE — Patient Instructions (Addendum)
Medication Instructions:  Your physician recommends that you continue on your current medications as directed. Please refer to the Current Medication list given to you today.   Labwork: Your physician recommends that you return for lab work (basic metabolic panel) in: at next office visit in 6 months   Testing/Procedures: None Ordered   Follow-Up: Your physician wants you to follow-up in: 6 months with Dr. Acie Fredrickson.  You will receive a reminder letter in the mail two months in advance. If you don't receive a letter, please call our office to schedule the follow-up appointment.   If you need a refill on your cardiac medications before your next appointment, please call your pharmacy.   Thank you for choosing [facility]! Christen Bame, RN [phone]

## 2017-07-25 NOTE — Telephone Encounter (Signed)
Spoke with patient who called to ask if she can take Claritin in the place of Benadryl for her allergy symptoms. I advised that Claritin or Zyrtec would be fine for her to try. She states Zyrtec and Benadryl make her sleepy so she is going to try the Claritin.  She verbalized understanding and thanked me for the call.

## 2017-07-25 NOTE — Telephone Encounter (Signed)
New message    Pt is calling asking if she can take claritin instead of benadryl. Please call.

## 2017-09-16 ENCOUNTER — Other Ambulatory Visit: Payer: Self-pay

## 2017-09-16 MED ORDER — METOPROLOL SUCCINATE ER 25 MG PO TB24: ORAL_TABLET | ORAL | 10 refills | Status: DC

## 2017-09-19 ENCOUNTER — Other Ambulatory Visit: Payer: Self-pay | Admitting: *Deleted

## 2017-09-19 MED ORDER — APIXABAN 5 MG PO TABS: ORAL_TABLET | ORAL | 11 refills | Status: DC

## 2017-09-25 ENCOUNTER — Telehealth: Payer: Self-pay | Admitting: Cardiovascular Disease

## 2017-09-25 NOTE — Telephone Encounter (Signed)
If she has been on Eliquis, it would seem very unlikely that she has a DVT.

## 2017-09-25 NOTE — Telephone Encounter (Signed)
New message    Patient wants to discuss Eliquis and swelling in LEG. Please call   Pt c/o swelling: STAT is pt has developed SOB within 24 hours  1) How much weight have you gained and in what time span? N/A  2) If swelling, where is the swelling located? Right leg  3) Are you currently taking a fluid pill? NO  4) Are you currently SOB? NO  5) Do you have a log of your daily weights (if so, list)? NO  6) Have you gained 3 pounds in a day or 5 pounds in a week? N/A  7) Have you traveled recently? NO

## 2017-09-25 NOTE — Telephone Encounter (Signed)
Pt is having some pain in her right knee, just concerned about blood clot in right calf since it is sore and a little larger than left calf.  Pt states she has moved and is establishing with doctor in area but has been unable to get appointment yet. Pt states she has been taking Eliquis and has not missed any doses recently. Pt advised our pharmacist has said clot  very unlikely while pt on Eliquis, pt is aware. Pt advised I will forward to Dr Acie Fredrickson for review

## 2017-09-25 NOTE — Telephone Encounter (Signed)
Pt states she is having some issues with her right knee and has had cortisone injections in knee. Pt now has soreness in the calf of her right leg, no discoloration or warmness, feels like it may be from being more active in the last few days.

## 2017-09-25 NOTE — Telephone Encounter (Signed)
Discussed with patient

## 2017-10-29 DIAGNOSIS — M25561 Pain in right knee: Secondary | ICD-10-CM | POA: Diagnosis not present

## 2017-10-29 DIAGNOSIS — M1710 Unilateral primary osteoarthritis, unspecified knee: Secondary | ICD-10-CM | POA: Diagnosis not present

## 2017-10-29 DIAGNOSIS — Z6827 Body mass index (BMI) 27.0-27.9, adult: Secondary | ICD-10-CM | POA: Diagnosis not present

## 2017-11-26 DIAGNOSIS — H3589 Other specified retinal disorders: Secondary | ICD-10-CM | POA: Diagnosis not present

## 2017-11-26 DIAGNOSIS — H11423 Conjunctival edema, bilateral: Secondary | ICD-10-CM | POA: Diagnosis not present

## 2017-11-26 DIAGNOSIS — H43811 Vitreous degeneration, right eye: Secondary | ICD-10-CM | POA: Diagnosis not present

## 2017-11-26 DIAGNOSIS — H1045 Other chronic allergic conjunctivitis: Secondary | ICD-10-CM | POA: Diagnosis not present

## 2017-11-27 DIAGNOSIS — F411 Generalized anxiety disorder: Secondary | ICD-10-CM | POA: Diagnosis not present

## 2017-11-27 DIAGNOSIS — I4891 Unspecified atrial fibrillation: Secondary | ICD-10-CM | POA: Diagnosis not present

## 2017-11-27 DIAGNOSIS — Z0000 Encounter for general adult medical examination without abnormal findings: Secondary | ICD-10-CM | POA: Diagnosis not present

## 2017-11-27 DIAGNOSIS — Z23 Encounter for immunization: Secondary | ICD-10-CM | POA: Diagnosis not present

## 2017-12-03 DIAGNOSIS — M25561 Pain in right knee: Secondary | ICD-10-CM | POA: Diagnosis not present

## 2017-12-10 DIAGNOSIS — M1711 Unilateral primary osteoarthritis, right knee: Secondary | ICD-10-CM | POA: Diagnosis not present

## 2017-12-17 DIAGNOSIS — M1711 Unilateral primary osteoarthritis, right knee: Secondary | ICD-10-CM | POA: Diagnosis not present

## 2018-01-07 DIAGNOSIS — R200 Anesthesia of skin: Secondary | ICD-10-CM | POA: Diagnosis not present

## 2018-01-07 DIAGNOSIS — R202 Paresthesia of skin: Secondary | ICD-10-CM | POA: Diagnosis not present

## 2018-01-07 DIAGNOSIS — Z6827 Body mass index (BMI) 27.0-27.9, adult: Secondary | ICD-10-CM | POA: Diagnosis not present

## 2018-01-21 DIAGNOSIS — R202 Paresthesia of skin: Secondary | ICD-10-CM | POA: Diagnosis not present

## 2018-01-21 DIAGNOSIS — R29898 Other symptoms and signs involving the musculoskeletal system: Secondary | ICD-10-CM | POA: Diagnosis not present

## 2018-01-23 DIAGNOSIS — R29898 Other symptoms and signs involving the musculoskeletal system: Secondary | ICD-10-CM | POA: Insufficient documentation

## 2018-01-23 DIAGNOSIS — R202 Paresthesia of skin: Secondary | ICD-10-CM | POA: Insufficient documentation

## 2018-02-03 ENCOUNTER — Other Ambulatory Visit: Payer: BLUE CROSS/BLUE SHIELD | Admitting: *Deleted

## 2018-02-03 ENCOUNTER — Ambulatory Visit: Payer: BLUE CROSS/BLUE SHIELD | Admitting: Cardiovascular Disease

## 2018-02-03 ENCOUNTER — Encounter: Payer: Self-pay | Admitting: Cardiovascular Disease

## 2018-02-03 VITALS — BP 96/72 | HR 99 | Ht 65.0 in | Wt 150.0 lb

## 2018-02-03 DIAGNOSIS — Z780 Asymptomatic menopausal state: Secondary | ICD-10-CM | POA: Diagnosis not present

## 2018-02-03 DIAGNOSIS — I480 Paroxysmal atrial fibrillation: Secondary | ICD-10-CM | POA: Diagnosis not present

## 2018-02-03 DIAGNOSIS — Z1231 Encounter for screening mammogram for malignant neoplasm of breast: Secondary | ICD-10-CM | POA: Diagnosis not present

## 2018-02-03 DIAGNOSIS — M8589 Other specified disorders of bone density and structure, multiple sites: Secondary | ICD-10-CM | POA: Diagnosis not present

## 2018-02-03 LAB — BASIC METABOLIC PANEL
BUN/Creatinine Ratio: 17 (ref 12–28)
BUN: 12 mg/dL (ref 8–27)
CHLORIDE: 100 mmol/L (ref 96–106)
CO2: 24 mmol/L (ref 20–29)
CREATININE: 0.7 mg/dL (ref 0.57–1.00)
Calcium: 9.6 mg/dL (ref 8.7–10.3)
GFR calc Af Amer: 107 mL/min/{1.73_m2} (ref >59)
GFR calc non Af Amer: 93 mL/min/{1.73_m2} (ref >59)
GLUCOSE: 99 mg/dL (ref 65–99)
POTASSIUM: 4.1 mmol/L (ref 3.5–5.2)
Sodium: 138 mmol/L (ref 134–144)

## 2018-02-03 MED ORDER — APIXABAN 5 MG PO TABS: ORAL_TABLET | ORAL | 11 refills | Status: DC

## 2018-02-03 MED ORDER — METOPROLOL SUCCINATE ER 25 MG PO TB24: ORAL_TABLET | ORAL | 11 refills | Status: DC

## 2018-02-03 MED ORDER — FLECAINIDE ACETATE 50 MG PO TABS
ORAL_TABLET | ORAL | 11 refills | Status: DC
Start: 2018-02-03 — End: 2019-01-21

## 2018-02-03 NOTE — Patient Instructions (Signed)

## 2018-02-03 NOTE — Progress Notes (Signed)
Cardiology Office Note   Date:  02/03/2018   ID:  Wendy Brown, DOB [birth year], MRN [id number]  PCP:  Lennie Odor, PA-C  Cardiologist:   Mertie Moores, MD   Chief Complaint  Patient presents with  . Atrial Fibrillation   1. Problem list 1. Atrial fibrillation   History of Present Illness: Wendy Brown is a 62 y.o. female who presents for new onset / new diagnosis of atrial fib.  Seen with daughter, Wendy Brown .   Was to have cataract surgery this am.  Was noticed to have rapid atrial fib.  Was sent to her primary care doctor who called [place] for advice.   She denies any chest pain or shortness of breath. She really cannot tell that her heart rate is regular. She knows that she's had palpitations over the past years   Has seen Dr. Olevia Perches and had atrial fib about 6 years ( found after she fell through the attic floor)  Does not work - takes care of her disabled son.  No CP , no dyspnea.   Can have DOE walking up stairs.   Has lots of MSK pain related to her fall in 2010 - sleeps in a recliner   Dec. 9, 2016:  Wendy Brown was admitted to the hospital with rapid atrial fib.  Was started on amiodarone. Has converted to NSR ,  OnEliuqis.  Feeling better.  Has cut her coffee intake.  Eating lot fat meat.   Doing well.  No additional episodes of Afib.   Getting some exercise.    Was a bit fatigued immediately after she left the hospital because of all the new medications. Has been using Vaping cigarettes.    March 28, 2016:  Doing well. Still in NSR , on amio 200 a day   Nov. 13, 2017:  Namiyah is seen today for her PAF Had cortisone injections and had heart palpitations as a side effect.  PAF seems to be better controlled on the flecainide  Not exercising much.    Has some neck and back issues.   Has quit smoking   February 07, 2017:  Wendy Brown is seen today for follow up of her PAF Is eating better  Is not smoking .  Using a fit bit.    Oct. 4, 2018:  Wendy Brown is  seen today  Moved out to [facility] Has not had a kitchen for a while.    Is remodling the whole house. Has been under lots of stress. But she feels a special calm when she is is in the mountains.   Has not had any significant palpitations.    Is not exercing much .  [place]   February 03, 2018: Wendy Brown  is seen today for follow-up visit.  She has a history of paroxysmal atrial fibrillation.  She has been able to maintain sinus rhythm. Living at [facility] now.  Has periodic injections of hyaluronic acid into her knees.     Past Medical History:  Diagnosis Date  . A-fib ([place])   . Cataract     Past Surgical History:  Procedure Laterality Date  . ABDOMINAL HYSTERECTOMY    . CERVICAL DISC ARTHROPLASTY     NECK  . RIB FRACTURE SURGERY       Current Outpatient Medications  Medication Sig Dispense Refill  . ALPRAZolam (XANAX) 0.25 MG tablet Take 1 tablet by mouth daily as needed for anxiety  0  . apixaban (ELIQUIS) 5 MG  TABS tablet TAKE 1 TABLET(5 MG) BY MOUTH TWICE DAILY 60 tablet 11  . flecainide (TAMBOCOR) 50 MG tablet TAKE 1 TABLET(50 MG) BY MOUTH TWICE DAILY 60 tablet 8  . lidocaine (LIDODERM) 5 % Place 1 patch onto the skin as directed. Apply 1 patch to knee for 12 hours, then remove for 12 hours  0  . metoprolol succinate (TOPROL-XL) 25 MG 24 hr tablet TAKE 1 TABLET(25 MG) BY MOUTH DAILY 30 tablet 10  . venlafaxine XR (EFFEXOR XR) 37.5 MG 24 hr capsule Take 1 tablet by mouth daily.     No current facility-administered medications for this visit.     Allergies:   Atorvastatin and Latex    Social History:  The patient  reports that she has quit smoking. Her smoking use included cigarettes. She smoked 1.50 packs per day. She has never used smokeless tobacco. She reports that she drinks alcohol. She reports that she does not use drugs.   Family History:  The patient's She was adopted. Family history is unknown by patient.    ROS:   Noted in  current history, otherwise review of systems is negative.   Physical Exam: Blood pressure 96/72, pulse 99, height 5\' 5"  (1.651 m), weight 150 lb (68 kg), SpO2 (!) 63 %.  GEN:  Well nourished, well developed in no acute distress HEENT: Normal NECK: No JVD; No carotid bruits LYMPHATICS: No lymphadenopathy CARDIAC: RRR  RESPIRATORY:  Clear to auscultation without rales, wheezing or rhonchi  ABDOMEN: Soft, non-tender, non-distended MUSCULOSKELETAL:  No edema; No deformity  SKIN: Warm and dry NEUROLOGIC:  Alert and oriented x 3     EKG: February 03, 2018: Normal sinus rhythm at 63.  Normal EKG.      Recent Labs: 07/24/2017: BUN 11; Creatinine, Ser 0.53; Hemoglobin 15.4; Platelets 259; Potassium 4.0; Sodium 139    Lipid Panel    Component Value Date/Time   CHOL 160 06/28/2016 0802   TRIG 65 06/28/2016 0802   HDL 61 06/28/2016 0802   CHOLHDL 2.6 06/28/2016 0802   VLDL 13 06/28/2016 0802   LDLCALC 86 06/28/2016 0802      Wt Readings from Last 3 Encounters:  02/03/18 150 lb (68 kg)  07/25/17 145 lb 12.8 oz (66.1 kg)  02/07/17 150 lb 12.8 oz (68.4 kg)      Other studies Reviewed: Additional studies/ records that were reviewed today include: . Review of the above records demonstrates:    ASSESSMENT AND PLAN:  1.  Atrial fibrillation with rapid ventricular response.  Has maintained NSR .   On Eliquis .    2. Anxiety :     3. Back pain :    May need an implantable Tens unit.  She should hold her Eliquis for 2 days prior to her procedure . And restart 1-2 days following the procedure      Mertie Moores, MD  02/03/2018 1:53 PM    [facility] [address], [place], [place]  [address] Phone: [phone]; Fax: [phone]

## 2018-02-04 DIAGNOSIS — Z6827 Body mass index (BMI) 27.0-27.9, adult: Secondary | ICD-10-CM | POA: Diagnosis not present

## 2018-02-04 DIAGNOSIS — M25511 Pain in right shoulder: Secondary | ICD-10-CM | POA: Diagnosis not present

## 2018-02-04 DIAGNOSIS — M542 Cervicalgia: Secondary | ICD-10-CM | POA: Diagnosis not present

## 2018-02-04 DIAGNOSIS — R29898 Other symptoms and signs involving the musculoskeletal system: Secondary | ICD-10-CM | POA: Diagnosis not present

## 2018-02-04 DIAGNOSIS — R202 Paresthesia of skin: Secondary | ICD-10-CM | POA: Diagnosis not present

## 2018-02-06 DIAGNOSIS — R922 Inconclusive mammogram: Secondary | ICD-10-CM | POA: Diagnosis not present

## 2018-02-17 DIAGNOSIS — Z981 Arthrodesis status: Secondary | ICD-10-CM | POA: Diagnosis not present

## 2018-02-17 DIAGNOSIS — M7918 Myalgia, other site: Secondary | ICD-10-CM | POA: Diagnosis not present

## 2018-02-17 DIAGNOSIS — M47812 Spondylosis without myelopathy or radiculopathy, cervical region: Secondary | ICD-10-CM | POA: Diagnosis not present

## 2018-02-17 DIAGNOSIS — M5030 Other cervical disc degeneration, unspecified cervical region: Secondary | ICD-10-CM | POA: Diagnosis not present

## 2018-02-28 ENCOUNTER — Telehealth: Payer: Self-pay | Admitting: Cardiovascular Disease

## 2018-02-28 DIAGNOSIS — M5412 Radiculopathy, cervical region: Secondary | ICD-10-CM | POA: Diagnosis not present

## 2018-02-28 DIAGNOSIS — Z79899 Other long term (current) drug therapy: Secondary | ICD-10-CM | POA: Diagnosis not present

## 2018-02-28 DIAGNOSIS — G894 Chronic pain syndrome: Secondary | ICD-10-CM | POA: Diagnosis not present

## 2018-02-28 NOTE — Telephone Encounter (Signed)
   [facility] Pre-operative Risk Assessment    Request for surgical clearance:  1. What type of surgery is being performed?  Spinal Cord Stimulator   2. When is this surgery scheduled?  TBD   3. What type of clearance is required (medical clearance vs. Pharmacy clearance to hold med vs. Both)?  Both  4. Are there any medications that need to be held prior to surgery and how long? Eliquis 4 days prior - paperwork does mention if pt having Spinal Cord Stimulator Trial they will need to remain off of the medication until the trial is over and leads are pulled which is typically 5-7 days.    5. Practice name and name of physician performing surgery? [facility]- Dr. Gavin Pound   6. What is your office phone number? [phone]    7.   What is your office fax number? [phone]  8.   Anesthesia type (None, local, MAC, general) ?  None listed   Wendy Brown,Wendy Brown 02/28/2018, 3:13 PM  _________________________________________________________________   (provider comments below)

## 2018-03-04 NOTE — Telephone Encounter (Signed)
Patient with diagnosis of atrial fibrillation on Eliquis for anticoagulation.    Procedure: spinal cord stimulator Date of procedure: TBD  CHADS2-VASc score of  1 (, female)  CrCl 89.4 Platelet count 259  Per office protocol, patient can hold Eliquis for 4 days prior to procedure.    Patient should restart Eliquis 48-72 hours after procedure, at discretion of procedure MD.  If this is for trial period - ok to hold Eliquis for the required 5-7 days

## 2018-03-05 NOTE — Telephone Encounter (Signed)
   Primary Cardiologist: Mertie Moores, MD  Chart reviewed as part of pre-operative protocol coverage. Given past medical history and time since last visit, based on ACC/AHA guidelines, HYE TRAWICK would be at acceptable risk for the planned procedure without further cardiovascular testing.   OK to hold Eliquis 4 days pre op, resume 48-72 hrs post op  I will route this recommendation to the requesting party via [facility] fax function and remove from pre-op pool.  Please call with questions.  Kerin Ransom, PA-C 03/05/2018, 2:19 PM

## 2018-03-20 DIAGNOSIS — M47812 Spondylosis without myelopathy or radiculopathy, cervical region: Secondary | ICD-10-CM | POA: Diagnosis not present

## 2018-03-20 DIAGNOSIS — G243 Spasmodic torticollis: Secondary | ICD-10-CM | POA: Diagnosis not present

## 2018-03-20 DIAGNOSIS — G894 Chronic pain syndrome: Secondary | ICD-10-CM | POA: Diagnosis not present

## 2018-03-31 DIAGNOSIS — M7918 Myalgia, other site: Secondary | ICD-10-CM | POA: Diagnosis not present

## 2018-05-20 DIAGNOSIS — M6249 Contracture of muscle, multiple sites: Secondary | ICD-10-CM | POA: Diagnosis not present

## 2018-05-20 DIAGNOSIS — M5412 Radiculopathy, cervical region: Secondary | ICD-10-CM | POA: Diagnosis not present

## 2018-05-20 DIAGNOSIS — M7918 Myalgia, other site: Secondary | ICD-10-CM | POA: Diagnosis not present

## 2018-05-20 DIAGNOSIS — M62838 Other muscle spasm: Secondary | ICD-10-CM | POA: Diagnosis not present

## 2018-06-09 DIAGNOSIS — G894 Chronic pain syndrome: Secondary | ICD-10-CM | POA: Diagnosis not present

## 2018-06-09 DIAGNOSIS — M5412 Radiculopathy, cervical region: Secondary | ICD-10-CM | POA: Diagnosis not present

## 2018-06-11 DIAGNOSIS — M6281 Muscle weakness (generalized): Secondary | ICD-10-CM | POA: Diagnosis not present

## 2018-06-11 DIAGNOSIS — M5412 Radiculopathy, cervical region: Secondary | ICD-10-CM | POA: Diagnosis not present

## 2018-06-18 DIAGNOSIS — M6281 Muscle weakness (generalized): Secondary | ICD-10-CM | POA: Diagnosis not present

## 2018-06-18 DIAGNOSIS — M5412 Radiculopathy, cervical region: Secondary | ICD-10-CM | POA: Diagnosis not present

## 2018-07-08 DIAGNOSIS — M1711 Unilateral primary osteoarthritis, right knee: Secondary | ICD-10-CM | POA: Diagnosis not present

## 2018-07-08 DIAGNOSIS — M5416 Radiculopathy, lumbar region: Secondary | ICD-10-CM | POA: Diagnosis not present

## 2018-07-08 DIAGNOSIS — M25561 Pain in right knee: Secondary | ICD-10-CM | POA: Diagnosis not present

## 2018-07-08 DIAGNOSIS — Z6827 Body mass index (BMI) 27.0-27.9, adult: Secondary | ICD-10-CM | POA: Diagnosis not present

## 2018-07-24 ENCOUNTER — Other Ambulatory Visit: Payer: BLUE CROSS/BLUE SHIELD

## 2018-07-28 ENCOUNTER — Other Ambulatory Visit: Payer: BLUE CROSS/BLUE SHIELD | Admitting: *Deleted

## 2018-07-28 ENCOUNTER — Other Ambulatory Visit: Payer: Self-pay | Admitting: Nurse Practitioner

## 2018-07-28 DIAGNOSIS — I480 Paroxysmal atrial fibrillation: Secondary | ICD-10-CM

## 2018-07-28 LAB — BASIC METABOLIC PANEL
BUN/Creatinine Ratio: 21 (ref 12–28)
BUN: 15 mg/dL (ref 8–27)
CALCIUM: 9.4 mg/dL (ref 8.7–10.3)
CO2: 24 mmol/L (ref 20–29)
CREATININE: 0.71 mg/dL (ref 0.57–1.00)
Chloride: 100 mmol/L (ref 96–106)
GFR, EST AFRICAN AMERICAN: 105 mL/min/{1.73_m2} (ref >59)
GFR, EST NON AFRICAN AMERICAN: 91 mL/min/{1.73_m2} (ref >59)
Glucose: 90 mg/dL (ref 65–99)
POTASSIUM: 4.5 mmol/L (ref 3.5–5.2)
Sodium: 139 mmol/L (ref 134–144)

## 2018-08-05 DIAGNOSIS — M1711 Unilateral primary osteoarthritis, right knee: Secondary | ICD-10-CM | POA: Diagnosis not present

## 2018-08-12 DIAGNOSIS — M1711 Unilateral primary osteoarthritis, right knee: Secondary | ICD-10-CM | POA: Diagnosis not present

## 2018-08-19 DIAGNOSIS — M1711 Unilateral primary osteoarthritis, right knee: Secondary | ICD-10-CM | POA: Diagnosis not present

## 2018-09-30 DIAGNOSIS — G894 Chronic pain syndrome: Secondary | ICD-10-CM | POA: Diagnosis not present

## 2018-09-30 DIAGNOSIS — M961 Postlaminectomy syndrome, not elsewhere classified: Secondary | ICD-10-CM | POA: Diagnosis not present

## 2018-10-01 DIAGNOSIS — M542 Cervicalgia: Secondary | ICD-10-CM | POA: Diagnosis not present

## 2018-10-01 DIAGNOSIS — M47892 Other spondylosis, cervical region: Secondary | ICD-10-CM | POA: Diagnosis not present

## 2018-10-01 DIAGNOSIS — M47812 Spondylosis without myelopathy or radiculopathy, cervical region: Secondary | ICD-10-CM | POA: Diagnosis not present

## 2018-11-12 DIAGNOSIS — G894 Chronic pain syndrome: Secondary | ICD-10-CM | POA: Diagnosis not present

## 2018-11-12 DIAGNOSIS — M47812 Spondylosis without myelopathy or radiculopathy, cervical region: Secondary | ICD-10-CM | POA: Diagnosis not present

## 2018-12-16 DIAGNOSIS — M47812 Spondylosis without myelopathy or radiculopathy, cervical region: Secondary | ICD-10-CM | POA: Diagnosis not present

## 2019-01-21 ENCOUNTER — Other Ambulatory Visit: Payer: Self-pay | Admitting: Cardiovascular Disease

## 2019-01-21 NOTE — Telephone Encounter (Signed)
Pt requesting refill for ELIQUIS 5 mg BID please address.

## 2019-01-21 NOTE — Telephone Encounter (Signed)
Eliquis 5mg  refill request received; pt is 62 yrs old, wt-68kg, Crea-0.71 on 07/28/2018, last seen by Dr. Acie Fredrickson on 02/03/2018 and has appt on 03/05/2019; will send in a refill.

## 2019-02-19 ENCOUNTER — Telehealth: Payer: Self-pay

## 2019-02-19 NOTE — Telephone Encounter (Signed)
Spoke with pt to confirm virtual video visit and obtain verbal consent. Pt gave consent for a video visit and will have her bp, hr, and weight ready for visit.   YOUR CARDIOLOGY TEAM HAS ARRANGED FOR AN E-VISIT FOR YOUR APPOINTMENT - PLEASE REVIEW IMPORTANT INFORMATION BELOW SEVERAL DAYS PRIOR TO YOUR APPOINTMENT  Due to the recent COVID-19 pandemic, we are transitioning in-person office visits to tele-medicine visits in an effort to decrease unnecessary exposure to our patients and staff. Medicare and most insurances are covering these visits without a copay needed. You will need a working email and a smartphone or computer with a camera and microphone. For patients that do not have these items, we can still complete the visit using a telephone but do prefer video when possible. If possible, we also ask that you have a blood pressure cuff and scale at home to measure your blood pressure, heart rate and weight prior to your scheduled appointment. Patients with clinical needs that need an in-person evaluation and testing will still be able to come to the office if absolutely necessary. If you have any questions, feel free to call our office.     DOWNLOADING THE SOFTWARE  Download the [employer] app to enable video and telephone visits with your [facility] Provider.   Instructions for downloading [employer] WebEx: - Go to https://www.webex.com/downloads.html and follow the instructions, or download the app on your smartphone [facility] [employer] Meetings). - If you have technical difficulties with downloading WebEx, please call WebEx at [phone]. - Once the app is downloaded (can be done on either mobile or desktop computer), go to Settings in the upper left hand corner.  Be sure that camera and audio are enabled.  - You will receive an email message with a link to the meeting with a time to join for your tele-health visit.  - Please download the app and have settings configured prior to the  appointment time.      2-3 DAYS BEFORE YOUR APPOINTMENT  One of our staff will call you to confirm that you have been able to set up your WebEx account. We will remind you check your blood pressure, heart rate and weight prior to your scheduled appointment. If you have an Apple Watch or Kardia, please upload any pertinent ECG strips the day before or morning of your appointment to [place]. Our staff will also make sure you have reviewed the consent and agree to move forward with your scheduled tele-health visit.    THE DAY OF YOUR APPOINTMENT  Approximately 15-20 minutes prior to your scheduled appointment, you will receive an e-mail directly from one of our staff member's [email or web address] e-mail accounts inviting you to join a WebEx meeting.  Please do not reply to that email - simply join the [employer].  Upon joining, a member of the office staff will speak with you initially through the WebEx platform to confirm medications, vital signs for the day and any symptoms you may be experiencing.  Please have this information available prior to the time of visit start.      CONSENT FOR TELE-HEALTH VISIT - PLEASE RVIEW  I hereby voluntarily request, consent and authorize [facility] and its employed or contracted physicians, physician assistants, nurse practitioners or other licensed health care professionals (the Practitioner), to provide me with telemedicine health care services (the "Services") as deemed necessary by the treating Practitioner. I acknowledge and consent to receive the Services by the Practitioner via telemedicine. I understand that the telemedicine  visit will involve communicating with the Practitioner through live audiovisual communication technology and the disclosure of certain medical information by electronic transmission. I acknowledge that I have been given the opportunity to request an in-person assessment or other available alternative prior to the telemedicine visit  and am voluntarily participating in the telemedicine visit.  I understand that I have the right to withhold or withdraw my consent to the use of telemedicine in the course of my care at any time, without affecting my right to future care or treatment, and that the Practitioner or I may terminate the telemedicine visit at any time. I understand that I have the right to inspect all information obtained and/or recorded in the course of the telemedicine visit and may receive copies of available information for a reasonable fee.  I understand that some of the potential risks of receiving the Services via telemedicine include:  Marland Kitchen Delay or interruption in medical evaluation due to technological equipment failure or disruption; . Information transmitted may not be sufficient (e.g. poor resolution of images) to allow for appropriate medical decision making by the Practitioner; and/or  . In rare instances, security protocols could fail, causing a breach of personal health information.  Furthermore, I acknowledge that it is my responsibility to provide information about my medical history, conditions and care that is complete and accurate to the best of my ability. I acknowledge that Practitioner's advice, recommendations, and/or decision may be based on factors not within their control, such as incomplete or inaccurate data provided by me or distortions of diagnostic images or specimens that may result from electronic transmissions. I understand that the practice of medicine is not an exact science and that Practitioner makes no warranties or guarantees regarding treatment outcomes. I acknowledge that I will receive a copy of this consent concurrently upon execution via email to the email address I last provided but may also request a printed copy by calling the office of [place].    I understand that my insurance will be billed for this visit.   I have read or had this consent read to me. . I understand  the contents of this consent, which adequately explains the benefits and risks of the Services being provided via telemedicine.  . I have been provided ample opportunity to ask questions regarding this consent and the Services and have had my questions answered to my satisfaction. . I give my informed consent for the services to be provided through the use of telemedicine in my medical care  By participating in this telemedicine visit I agree to the above.

## 2019-02-26 ENCOUNTER — Telehealth: Payer: Self-pay | Admitting: Cardiovascular Disease

## 2019-02-26 NOTE — Telephone Encounter (Signed)
Spoke with patient who confirmed all demographics. Has smart phone and is able to use My Chart. Will have vitals ready for visit.

## 2019-02-26 NOTE — Telephone Encounter (Signed)
Left message for patient to call L. Cheron Schaumann back regarding appointment with Dr. Acie Fredrickson. Please put call through to L. Cheron Schaumann' VM and I will call patient back:[phone].

## 2019-03-04 NOTE — Progress Notes (Signed)
Virtual Visit via Video Note   This visit type was conducted due to national recommendations for restrictions regarding the COVID-19 Pandemic (e.g. social distancing) in an effort to limit this patient's exposure and mitigate transmission in our community.  Due to her co-morbid illnesses, this patient is at least at moderate risk for complications without adequate follow up.  This format is felt to be most appropriate for this patient at this time.  All issues noted in this document were discussed and addressed.  A limited physical exam was performed with this format.  Please refer to the patient's chart for her consent to telehealth for [facility].   Date:  03/04/2019   ID:  Wendy Brown, DOB 1956/08/05, MRN [id number]  Patient Location: Home Provider Location: Office  PCP:  Lennie Odor, PA-C  Cardiologist:  Mertie Moores, MD  Electrophysiologist:  None   Evaluation Performed:  Follow-Up Visit  Chief Complaint:  Paroxysmal atrial fib  1. Problem list 1. Atrial fibrillation   Past notes  Wendy Brown is a 63 y.o. female who presents for new onset / new diagnosis of atrial fib.  Seen with daughter, Wendy Brown .   Was to have cataract surgery this am.  Was noticed to have rapid atrial fib.  Was sent to her primary care doctor who called [place] for advice.   She denies any chest pain or shortness of breath. She really cannot tell that her heart rate is regular. She knows that she's had palpitations over the past years   Has seen Dr. Olevia Perches and had atrial fib about 6 years ( found after she fell through the attic floor)  Does not work - takes care of her disabled son.  No CP , no dyspnea.   Can have DOE walking up stairs.   Has lots of MSK pain related to her fall in 2010 - sleeps in a recliner   Dec. 9, 2016:  Wendy Brown was admitted to the hospital with rapid atrial fib.  Was started on amiodarone. Has converted to NSR ,  OnEliuqis.  Feeling better.  Has cut her  coffee intake.  Eating lot fat meat.   Doing well.  No additional episodes of Afib.   Getting some exercise.    Was a bit fatigued immediately after she left the hospital because of all the new medications. Has been using Vaping cigarettes.    March 28, 2016:  Doing well. Still in NSR , on amio 200 a day   Nov. 13, 2017:  Wendy Brown is seen today for her PAF Had cortisone injections and had heart palpitations as a side effect.  PAF seems to be better controlled on the flecainide  Not exercising much.    Has some neck and back issues.   Has quit smoking   February 07, 2017:  Wendy Brown is seen today for follow up of her PAF Is eating better  Is not smoking .  Using a fit bit.    Oct. 4, 2018:  Wendy Brown is seen today  Moved out to [facility] Has not had a kitchen for a while.    Is remodling the whole house. Has been under lots of stress. But she feels a special calm [place].   Has not had any significant palpitations.    Is not exercing much .  [place]   February 03, 2018: Wendy Brown  is seen today for follow-up visit.  She has a history  of paroxysmal atrial fibrillation.  She has been able to maintain sinus rhythm. Living at [facility] now.  Has periodic injections of hyaluronic acid into her knees.    Mar 05, 2019    Wendy Brown is a 63 y.o. female with  PAF. No recent palpitations Has had 1 episode of PAF .   Lasted for several hours , took an extra metoprolol which helped it resolve.  Has lots of neck pain .  Had a nerve block which did not work .   Has PAF,  Is on Eliquis and Flecainide   Wears a mask when she goes out.   Uses hand gel  The patient does not have symptoms concerning for COVID-19 infection (fever, chills, cough, or new shortness of breath).    Past Medical History:  Diagnosis Date  . A-fib ([place])   . Cataract    Past Surgical History:  Procedure Laterality Date  . ABDOMINAL  HYSTERECTOMY    . CERVICAL DISC ARTHROPLASTY     NECK  . RIB FRACTURE SURGERY       No outpatient medications have been marked as taking for the 03/05/19 encounter (Appointment) with Franko Hilliker, Wonda Cheng, MD.     Allergies:   Atorvastatin and Latex   Social History   Tobacco Use  . Smoking status: Former Smoker    Packs/day: 1.50    Types: Cigarettes  . Smokeless tobacco: Never Used  Substance Use Topics  . Alcohol use: Yes    Alcohol/week: 0.0 standard drinks    Comment: OCCASSIONALLY  . Drug use: No     Family Hx: The patient's She was adopted. Family history is unknown by patient.  ROS:   Please see the history of present illness.     All other systems reviewed and are negative.   Prior CV studies:   The following studies were reviewed today:    Labs/Other Tests and Data Reviewed:    EKG:  An ECG dated April 2019 was personally reviewed today and demonstrated:  sinus brady.  normal QRS duration   Recent Labs: 07/28/2018: BUN 15; Creatinine, Ser 0.71; Potassium 4.5; Sodium 139   Recent Lipid Panel Lab Results  Component Value Date/Time   CHOL 160 06/28/2016 08:02 AM   TRIG 65 06/28/2016 08:02 AM   HDL 61 06/28/2016 08:02 AM   CHOLHDL 2.6 06/28/2016 08:02 AM   LDLCALC 86 06/28/2016 08:02 AM    Wt Readings from Last 3 Encounters:  02/03/18 150 lb (68 kg)  07/25/17 145 lb 12.8 oz (66.1 kg)  02/07/17 150 lb 12.8 oz (68.4 kg)     Objective:    Vital Signs:  There were no vitals taken for this visit.   VITAL SIGNS:  reviewed GEN:  no acute distress EYES:  sclerae anicteric, EOMI - Extraocular Movements Intact RESPIRATORY:  normal respiratory effort, symmetric expansion CARDIOVASCULAR:  no peripheral edema SKIN:  no rash, lesions or ulcers. MUSCULOSKELETAL:  no obvious deformities. NEURO:  alert and oriented x 3, no obvious focal deficit PSYCH:  normal affect  ASSESSMENT & PLAN:    PAF:   Overall doing well.   Cont. Eliquis and Flecainide.   Will get  an ECG next time she is in [place].  No syncope .  Lives in [place]    COVID-19 Education: The signs and symptoms of COVID-19 were discussed with the patient and how to seek care for testing (follow up with PCP or arrange E-visit).  The importance of social  distancing was discussed today.  Time:   Today, I have spent  21  minutes with the patient with telehealth technology discussing the above problems.     Medication Adjustments/Labs and Tests Ordered: Current medicines are reviewed at length with the patient today.  Concerns regarding medicines are outlined above.   Tests Ordered: No orders of the defined types were placed in this encounter.   Medication Changes: No orders of the defined types were placed in this encounter.   Disposition:  Follow up in 1 year(s)  Signed, Mertie Moores, MD  03/04/2019 9:12 PM    [facility]

## 2019-03-05 ENCOUNTER — Telehealth (INDEPENDENT_AMBULATORY_CARE_PROVIDER_SITE_OTHER): Payer: BLUE CROSS/BLUE SHIELD | Admitting: Cardiovascular Disease

## 2019-03-05 ENCOUNTER — Encounter: Payer: Self-pay | Admitting: Cardiovascular Disease

## 2019-03-05 ENCOUNTER — Telehealth: Payer: Self-pay | Admitting: Cardiovascular Disease

## 2019-03-05 ENCOUNTER — Telehealth: Payer: BLUE CROSS/BLUE SHIELD | Admitting: Cardiovascular Disease

## 2019-03-05 ENCOUNTER — Ambulatory Visit: Payer: BLUE CROSS/BLUE SHIELD | Admitting: Cardiovascular Disease

## 2019-03-05 ENCOUNTER — Other Ambulatory Visit: Payer: Self-pay

## 2019-03-05 VITALS — BP 111/68 | HR 74 | Ht 61.0 in | Wt 152.0 lb

## 2019-03-05 DIAGNOSIS — Z13220 Encounter for screening for lipoid disorders: Secondary | ICD-10-CM

## 2019-03-05 DIAGNOSIS — I480 Paroxysmal atrial fibrillation: Secondary | ICD-10-CM | POA: Diagnosis not present

## 2019-03-05 DIAGNOSIS — Z7189 Other specified counseling: Secondary | ICD-10-CM

## 2019-03-05 MED ORDER — PROPRANOLOL HCL 10 MG PO TABS: 10.0000 mg | ORAL_TABLET | Freq: Four times a day (QID) | ORAL | 11 refills | Status: AC | PRN

## 2019-03-05 NOTE — Telephone Encounter (Signed)
Rx has been sent to patient's pharmacy. 

## 2019-03-05 NOTE — Patient Instructions (Signed)
Medication Instructions:  Your physician recommends that you continue on your current medications as directed. Please refer to the Current Medication list given to you today.  If you need a refill on your cardiac medications before your next appointment, please call your pharmacy.   Lab work: Your physician recommends that you return for lab work on Wednesday June 3 at 11:00 am  This is fasting lab work, however since you are driving we do not want you to get light-headed or dizzy. You may eat a light breakfast that morning and drink water prior to your lab appointment.   Testing/Procedures: None Ordered    Follow-Up: Your physician recommends that you come into our office on Wednesday June 3 for an EKG at 11:00 am    At [facility], you and your health needs are our priority.  As part of our continuing mission to provide you with exceptional heart care, we have created designated Provider Care Teams.  These Care Teams include your primary Cardiologist (physician) and Advanced Practice Providers (APPs -  Physician Assistants and Nurse Practitioners) who all work together to provide you with the care you need, when you need it. You will need a follow up appointment in:  1 years.  Please call our office 2 months in advance to schedule this appointment.  You may see Mertie Moores, MD or one of the following Advanced Practice Providers on your designated Care Team: Richardson Dopp, PA-C [place], [place] . Daune Perch, NP

## 2019-03-05 NOTE — Telephone Encounter (Signed)
Follow Up:   Today , Dr Acie Fredrickson had mention a medicine for her Atrial Fib. She could not remember te name of it.She would like for him to go ahead and call that in for her please.

## 2019-03-05 NOTE — Telephone Encounter (Signed)
Please add propranolol 10 mg QID PRN to her medicatons

## 2019-03-18 ENCOUNTER — Telehealth: Payer: Self-pay | Admitting: Cardiovascular Disease

## 2019-03-18 NOTE — Telephone Encounter (Signed)
Patient called wanting to switch her lab and EKG appt to 6/4 at 12 pm.  Did not reschedule as it said not to move.

## 2019-03-18 NOTE — Telephone Encounter (Signed)
Made aware Dr. Satira Mccallum is not in the office today.She understands Sharyn Lull will follow up with her tomorrow afternoon. She is not coming in on 6/3 for PCP visit anymore.  This has been changed to tele-health visit (she just found this out a few hours ago).  She is instead going to take day trip here on 6/4 to do her taxes and get EKG/lab. She is asking for later time d/t it taking her 3 1/2 - 4 hours to get here. She understands MIchelle will call her to reschedule.

## 2019-03-19 NOTE — Telephone Encounter (Signed)
Left message with patient's son that we can reschedule her appointments to June 4 and to call our office with questions or concerns.

## 2019-03-20 ENCOUNTER — Other Ambulatory Visit: Payer: Self-pay | Admitting: Cardiovascular Disease

## 2019-03-20 NOTE — Telephone Encounter (Signed)
   Spoke with patient and advised her of new appointment date/time. I reviewed questions below and advised her that if she develops symptoms or comes into contact with anyone who tests positive or has symptoms of Covid-19 to call us to reschedule. She thanked me for the call.   COVID-19 Pre-Screening Questions:  . In the past 7 to 10 days have you had a cough,  shortness of breath, headache, congestion, fever (100 or greater) body aches, chills, sore throat, or sudden loss of taste or sense of smell? No . Have you been around anyone with known Covid 19. No . Have you been around anyone who is awaiting Covid 19 test results in the past 7 to 10 days? No . Have you been around anyone who has been exposed to Covid 19, or has mentioned symptoms of Covid 19 within the past 7 to 10 days? NO  If you have any concerns/questions about symptoms patients report during screening (either on the phone or at threshold). Contact the provider seeing the patient or DOD for further guidance.  If neither are available contact a member of the leadership team.

## 2019-03-20 NOTE — Telephone Encounter (Signed)
Pt last saw Dr Acie Fredrickson 03/05/19 telemedicine COVID-19, last labs 07/28/18 Creat 0.71, age 63, weight 64.9kg on 08/19/18 at [facility], based on specified criteria pt is on appropriate dosage of Eliquis 5mg  BID.  Will refill rx.

## 2019-03-24 DIAGNOSIS — G894 Chronic pain syndrome: Secondary | ICD-10-CM | POA: Diagnosis not present

## 2019-03-24 DIAGNOSIS — M47812 Spondylosis without myelopathy or radiculopathy, cervical region: Secondary | ICD-10-CM | POA: Diagnosis not present

## 2019-03-24 DIAGNOSIS — M5412 Radiculopathy, cervical region: Secondary | ICD-10-CM | POA: Diagnosis not present

## 2019-03-24 DIAGNOSIS — G243 Spasmodic torticollis: Secondary | ICD-10-CM | POA: Diagnosis not present

## 2019-03-25 ENCOUNTER — Other Ambulatory Visit: Payer: BLUE CROSS/BLUE SHIELD

## 2019-03-25 ENCOUNTER — Telehealth: Payer: Self-pay | Admitting: *Deleted

## 2019-03-25 ENCOUNTER — Ambulatory Visit: Payer: BLUE CROSS/BLUE SHIELD

## 2019-03-25 DIAGNOSIS — Z0000 Encounter for general adult medical examination without abnormal findings: Secondary | ICD-10-CM | POA: Diagnosis not present

## 2019-03-25 NOTE — Telephone Encounter (Signed)
    COVID-19 Pre-Screening Questions:  . In the past 7 to 10 days have you had a cough,  shortness of breath, headache, congestion, fever (100 or greater) body aches, chills, sore throat, or sudden loss of taste or sense of smell? . Have you been around anyone with known Covid 19. . Have you been around anyone who is awaiting Covid 19 test results in the past 7 to 10 days? . Have you been around anyone who has been exposed to Covid 19, or has mentioned symptoms of Covid 19 within the past 7 to 10 days?  If you have any concerns/questions about symptoms patients report during screening (either on the phone or at threshold). Contact the provider seeing the patient or DOD for further guidance.  If neither are available contact a member of the leadership team.           Contacted patient via phone call. No to all Covid 19 questions. Has a mask. KB  

## 2019-03-25 NOTE — Telephone Encounter (Signed)

## 2019-03-26 ENCOUNTER — Other Ambulatory Visit: Payer: Self-pay

## 2019-03-26 ENCOUNTER — Ambulatory Visit (INDEPENDENT_AMBULATORY_CARE_PROVIDER_SITE_OTHER): Payer: BC Managed Care – PPO | Admitting: *Deleted

## 2019-03-26 ENCOUNTER — Other Ambulatory Visit: Payer: BC Managed Care – PPO | Admitting: *Deleted

## 2019-03-26 VITALS — HR 71 | Ht 61.0 in

## 2019-03-26 DIAGNOSIS — I480 Paroxysmal atrial fibrillation: Secondary | ICD-10-CM

## 2019-03-26 DIAGNOSIS — Z13220 Encounter for screening for lipoid disorders: Secondary | ICD-10-CM | POA: Diagnosis not present

## 2019-03-26 LAB — HEPATIC FUNCTION PANEL
ALT: 10 IU/L (ref 0–32)
AST: 17 IU/L (ref 0–40)
Albumin: 4.4 g/dL (ref 3.8–4.8)
Alkaline Phosphatase: 77 IU/L (ref 39–117)
Bilirubin Total: 0.3 mg/dL (ref 0.0–1.2)
Bilirubin, Direct: 0.09 mg/dL (ref 0.00–0.40)
Total Protein: 6.8 g/dL (ref 6.0–8.5)

## 2019-03-26 LAB — CBC
Hematocrit: 45.7 % (ref 34.0–46.6)
Hemoglobin: 15.8 g/dL (ref 11.1–15.9)
MCH: 33.7 pg — ABNORMAL HIGH (ref 26.6–33.0)
MCHC: 34.6 g/dL (ref 31.5–35.7)
MCV: 97 fL (ref 79–97)
Platelets: 286 10*3/uL (ref 150–450)
RBC: 4.69 x10E6/uL (ref 3.77–5.28)
RDW: 13.5 % (ref 11.7–15.4)
WBC: 8 10*3/uL (ref 3.4–10.8)

## 2019-03-26 LAB — BASIC METABOLIC PANEL
BUN/Creatinine Ratio: 23 (ref 12–28)
BUN: 17 mg/dL (ref 8–27)
CO2: 23 mmol/L (ref 20–29)
Calcium: 9.8 mg/dL (ref 8.7–10.3)
Chloride: 101 mmol/L (ref 96–106)
Creatinine, Ser: 0.75 mg/dL (ref 0.57–1.00)
GFR calc Af Amer: 98 mL/min/{1.73_m2} (ref >59)
GFR calc non Af Amer: 85 mL/min/{1.73_m2} (ref >59)
Glucose: 111 mg/dL — ABNORMAL HIGH (ref 65–99)
Potassium: 4.6 mmol/L (ref 3.5–5.2)
Sodium: 140 mmol/L (ref 134–144)

## 2019-03-26 LAB — LIPID PANEL
Chol/HDL Ratio: 4.3 ratio (ref 0.0–4.4)
Cholesterol, Total: 215 mg/dL — ABNORMAL HIGH (ref 100–199)
HDL: 50 mg/dL (ref >39)
LDL Calculated: 149 mg/dL — ABNORMAL HIGH (ref 0–99)
Triglycerides: 81 mg/dL (ref 0–149)
VLDL Cholesterol Cal: 16 mg/dL (ref 5–40)

## 2019-03-26 LAB — TSH: TSH: 3.06 u[IU]/mL (ref 0.450–4.500)

## 2019-03-26 NOTE — Progress Notes (Signed)
1.  Reason for visit: EKG/Flecainide  2.  Name of MD requesting visit:  Nahser  3. H&P:  See epic  4.  ROS related to problem:  See epic  5.  Assessment and plan per MD:   EKG showing NSR, HR 71, QT/QTc 388/421.  Reviewed by Dr. Lovena Le, DOD.  No new orders received.

## 2019-04-21 DIAGNOSIS — M47812 Spondylosis without myelopathy or radiculopathy, cervical region: Secondary | ICD-10-CM | POA: Diagnosis not present

## 2019-05-20 DIAGNOSIS — M47812 Spondylosis without myelopathy or radiculopathy, cervical region: Secondary | ICD-10-CM | POA: Diagnosis not present

## 2019-05-20 DIAGNOSIS — G894 Chronic pain syndrome: Secondary | ICD-10-CM | POA: Diagnosis not present

## 2019-06-05 DIAGNOSIS — M47812 Spondylosis without myelopathy or radiculopathy, cervical region: Secondary | ICD-10-CM | POA: Diagnosis not present

## 2019-06-08 DIAGNOSIS — Z1231 Encounter for screening mammogram for malignant neoplasm of breast: Secondary | ICD-10-CM | POA: Diagnosis not present

## 2019-06-08 DIAGNOSIS — H35371 Puckering of macula, right eye: Secondary | ICD-10-CM | POA: Diagnosis not present

## 2019-06-08 DIAGNOSIS — H5202 Hypermetropia, left eye: Secondary | ICD-10-CM | POA: Diagnosis not present

## 2019-07-08 DIAGNOSIS — M47812 Spondylosis without myelopathy or radiculopathy, cervical region: Secondary | ICD-10-CM | POA: Diagnosis not present

## 2019-07-08 DIAGNOSIS — G894 Chronic pain syndrome: Secondary | ICD-10-CM | POA: Diagnosis not present

## 2019-07-08 DIAGNOSIS — G243 Spasmodic torticollis: Secondary | ICD-10-CM | POA: Diagnosis not present

## 2019-07-16 ENCOUNTER — Other Ambulatory Visit: Payer: Self-pay | Admitting: Cardiovascular Disease

## 2019-08-10 DIAGNOSIS — M7918 Myalgia, other site: Secondary | ICD-10-CM | POA: Diagnosis not present

## 2019-08-21 DIAGNOSIS — Z23 Encounter for immunization: Secondary | ICD-10-CM | POA: Diagnosis not present

## 2019-09-07 ENCOUNTER — Other Ambulatory Visit: Payer: Self-pay | Admitting: Cardiovascular Disease

## 2019-09-07 NOTE — Telephone Encounter (Signed)
Age 63, weight 69kg, SCr 0.75 on 03/26/19, last OV May 2020, afib indication

## 2019-09-10 DIAGNOSIS — M62838 Other muscle spasm: Secondary | ICD-10-CM | POA: Diagnosis not present

## 2019-09-10 DIAGNOSIS — M6249 Contracture of muscle, multiple sites: Secondary | ICD-10-CM | POA: Diagnosis not present

## 2019-09-10 DIAGNOSIS — M5412 Radiculopathy, cervical region: Secondary | ICD-10-CM | POA: Diagnosis not present

## 2019-09-10 DIAGNOSIS — G894 Chronic pain syndrome: Secondary | ICD-10-CM | POA: Diagnosis not present

## 2019-09-24 DIAGNOSIS — H43812 Vitreous degeneration, left eye: Secondary | ICD-10-CM | POA: Diagnosis not present

## 2019-09-24 DIAGNOSIS — H35371 Puckering of macula, right eye: Secondary | ICD-10-CM | POA: Diagnosis not present

## 2019-09-24 DIAGNOSIS — H43811 Vitreous degeneration, right eye: Secondary | ICD-10-CM | POA: Diagnosis not present

## 2019-09-24 DIAGNOSIS — H11153 Pinguecula, bilateral: Secondary | ICD-10-CM | POA: Diagnosis not present

## 2019-10-08 DIAGNOSIS — Z79891 Long term (current) use of opiate analgesic: Secondary | ICD-10-CM | POA: Diagnosis not present

## 2019-10-08 DIAGNOSIS — M7918 Myalgia, other site: Secondary | ICD-10-CM | POA: Diagnosis not present

## 2019-11-10 DIAGNOSIS — M5412 Radiculopathy, cervical region: Secondary | ICD-10-CM | POA: Diagnosis not present

## 2019-11-10 DIAGNOSIS — M6249 Contracture of muscle, multiple sites: Secondary | ICD-10-CM | POA: Diagnosis not present

## 2019-11-10 DIAGNOSIS — M7918 Myalgia, other site: Secondary | ICD-10-CM | POA: Diagnosis not present

## 2019-11-10 DIAGNOSIS — M62838 Other muscle spasm: Secondary | ICD-10-CM | POA: Diagnosis not present

## 2020-01-05 DIAGNOSIS — M47812 Spondylosis without myelopathy or radiculopathy, cervical region: Secondary | ICD-10-CM | POA: Diagnosis not present

## 2020-01-19 DIAGNOSIS — Z79891 Long term (current) use of opiate analgesic: Secondary | ICD-10-CM | POA: Diagnosis not present

## 2020-01-19 DIAGNOSIS — M47812 Spondylosis without myelopathy or radiculopathy, cervical region: Secondary | ICD-10-CM | POA: Diagnosis not present

## 2020-01-19 DIAGNOSIS — G894 Chronic pain syndrome: Secondary | ICD-10-CM | POA: Diagnosis not present

## 2020-02-04 DIAGNOSIS — M47812 Spondylosis without myelopathy or radiculopathy, cervical region: Secondary | ICD-10-CM | POA: Diagnosis not present

## 2020-02-29 ENCOUNTER — Other Ambulatory Visit: Payer: Self-pay | Admitting: *Deleted

## 2020-02-29 MED ORDER — APIXABAN 5 MG PO TABS: ORAL_TABLET | ORAL | 5 refills | Status: DC

## 2020-02-29 NOTE — Telephone Encounter (Signed)
Prescription refill request for Eliquis received.  Last office visit: 03/05/2019 Scr: 0.75, 03/26/2019 Age: 64 y.o. Weight: 68.9 kg   Prescription refill sent.

## 2020-03-01 DIAGNOSIS — G894 Chronic pain syndrome: Secondary | ICD-10-CM | POA: Diagnosis not present

## 2020-03-01 DIAGNOSIS — M47812 Spondylosis without myelopathy or radiculopathy, cervical region: Secondary | ICD-10-CM | POA: Diagnosis not present

## 2020-03-29 DIAGNOSIS — I4891 Unspecified atrial fibrillation: Secondary | ICD-10-CM | POA: Diagnosis not present

## 2020-03-29 DIAGNOSIS — Z0000 Encounter for general adult medical examination without abnormal findings: Secondary | ICD-10-CM | POA: Diagnosis not present

## 2020-03-29 DIAGNOSIS — Z1211 Encounter for screening for malignant neoplasm of colon: Secondary | ICD-10-CM | POA: Diagnosis not present

## 2020-03-29 DIAGNOSIS — F411 Generalized anxiety disorder: Secondary | ICD-10-CM | POA: Diagnosis not present

## 2020-03-29 DIAGNOSIS — D6869 Other thrombophilia: Secondary | ICD-10-CM | POA: Diagnosis not present

## 2020-03-30 ENCOUNTER — Other Ambulatory Visit: Payer: Self-pay

## 2020-03-30 ENCOUNTER — Encounter: Payer: Self-pay | Admitting: Cardiovascular Disease

## 2020-03-30 ENCOUNTER — Ambulatory Visit: Payer: BC Managed Care – PPO | Admitting: Cardiovascular Disease

## 2020-03-30 VITALS — BP 100/68 | HR 74 | Ht 60.0 in | Wt 158.0 lb

## 2020-03-30 DIAGNOSIS — I480 Paroxysmal atrial fibrillation: Secondary | ICD-10-CM

## 2020-03-30 NOTE — Patient Instructions (Signed)
Medication Instructions:  Your physician recommends that you continue on your current medications as directed. Please refer to the Current Medication list given to you today.  *If you need a refill on your cardiac medications before your next appointment, please call your pharmacy*   Lab Work: None  If you have labs (blood work) drawn today and your tests are completely normal, you will receive your results only by: . MyChart Message (if you have MyChart) OR . A paper copy in the mail If you have any lab test that is abnormal or we need to change your treatment, we will call you to review the results.   Testing/Procedures: None   Follow-Up: At [facility], you and your health needs are our priority.  As part of our continuing mission to provide you with exceptional heart care, we have created designated Provider Care Teams.  These Care Teams include your primary Cardiologist (physician) and Advanced Practice Providers (APPs -  Physician Assistants and Nurse Practitioners) who all work together to provide you with the care you need, when you need it.  We recommend signing up for the patient portal called "MyChart".  Sign up information is provided on this After Visit Summary.  MyChart is used to connect with patients for Virtual Visits (Telemedicine).  Patients are able to view lab/test results, encounter notes, upcoming appointments, etc.  Non-urgent messages can be sent to your provider as well.   To learn more about what you can do with MyChart, go to https://www.mychart.com.    Your next appointment:   12 month(s)  The format for your next appointment:   In Person  Provider:   You may see Philip Nahser, MD or one of the following Advanced Practice Providers on your designated Care Team:    Scott Weaver, PA-C  Vin Bhagat, PA-C    Other Instructions   

## 2020-03-30 NOTE — Progress Notes (Signed)
Cardiology Office Note   Date:  03/30/2020   ID:  Pieper, Kasik 1956-05-08, MRN [id number]  PCP:  Lennie Odor, PA  Cardiologist:   Mertie Moores, MD   No chief complaint on file.  1. Problem list 1. Atrial fibrillation   History of Present Illness: Wendy Brown is a 64 y.o. female who presents for new onset / new diagnosis of atrial fib.  Seen with daughter, Wendy Brown .   Was to have cataract surgery this am.  Was noticed to have rapid atrial fib.  Was sent to her primary care doctor who called [place] for advice.   She denies any chest pain or shortness of breath. She really cannot tell that her heart rate is regular. She knows that she's had palpitations over the past years   Has seen Dr. Olevia Perches and had atrial fib about 6 years ( found after she fell through the attic floor)  Does not work - takes care of her disabled son.  No CP , no dyspnea.   Can have DOE walking up stairs.   Has lots of MSK pain related to her fall in 2010 - sleeps in a recliner   Dec. 9, 2016:  Wendy Brown was admitted to the hospital with rapid atrial fib.  Was started on amiodarone. Has converted to NSR ,  OnEliuqis.  Feeling better.  Has cut her coffee intake.  Eating lot fat meat.   Doing well.  No additional episodes of Afib.   Getting some exercise.    Was a bit fatigued immediately after she left the hospital because of all the new medications. Has been using Vaping cigarettes.    March 28, 2016:  Doing well. Still in NSR , on amio 200 a day   Nov. 13, 2017:  Wendy Brown is seen today for her PAF Had cortisone injections and had heart palpitations as a side effect.  PAF seems to be better controlled on the flecainide  Not exercising much.    Has some neck and back issues.   Has quit smoking   February 07, 2017:  Wendy Brown is seen today for follow up of her PAF Is eating better  Is not smoking .  Using a fit bit.    Oct. 4, 2018:  Wendy Brown is seen today  Moved out to [facility] Has not had a kitchen for a while.    Is remodling the whole house. Has been under lots of stress. But she feels a special calm when she is is in the mountains.   Has not had any significant palpitations.    Is not exercing much .  [place]   February 03, 2018: Wendy Brown she lives at [place] now.  She has been able to maintain sinus rhythm. Living at [facility] now.  Has periodic injections of hyaluronic acid into her knees.   March 30, 2020: Wendy Brown is seen today for follow-up of her paroxysmal atrial fibrillation.  She lives at [facility] now. Has not had her covid vaccines.   Has had lots of injections in her   No having an significant palpitations.    Has been on narcotic pain meds which cause significant constipation    Past Medical History:  Diagnosis Date   A-fib [facility])    Cataract     Past Surgical History:  Procedure Laterality Date   ABDOMINAL HYSTERECTOMY     CERVICAL DISC ARTHROPLASTY     NECK  RIB FRACTURE SURGERY       Current Outpatient Medications  Medication Sig Dispense Refill   ALPRAZolam (XANAX) 0.25 MG tablet Take 1 tablet by mouth daily as needed for anxiety  0   apixaban (ELIQUIS) 5 MG TABS tablet TAKE 1 TABLET(5 MG) BY MOUTH TWICE DAILY 60 tablet 5   flecainide (TAMBOCOR) 50 MG tablet TAKE 1 TABLET(50 MG) BY MOUTH TWICE DAILY 180 tablet 2   HYDROcodone-acetaminophen (NORCO/VICODIN) 5-325 MG tablet Take 1 tablet by mouth as needed.     lidocaine (LIDODERM) 5 % Place 1 patch onto the skin as directed. Apply 1 patch to knee for 12 hours, then remove for 12 hours  0   metoprolol succinate (TOPROL-XL) 25 MG 24 hr tablet TAKE 1 TABLET(25 MG) BY MOUTH DAILY 90 tablet 2   propranolol (INDERAL) 10 MG tablet Take 1 tablet (10 mg total) by mouth 4 (four) times daily as needed. 60 tablet 11   venlafaxine XR (EFFEXOR XR) 37.5 MG 24 hr capsule Take 1 tablet by mouth daily.     No current  facility-administered medications for this visit.    Allergies:   Atorvastatin and Latex    Social History:  The patient  reports that she has quit smoking. Her smoking use included cigarettes. She smoked 1.50 packs per day. She has never used smokeless tobacco. She reports current alcohol use. She reports that she does not use drugs.   Family History:  The patient's She was adopted. Family history is unknown by patient.    ROS:   Noted in current history, otherwise review of systems is negative.   Physical Exam: Blood pressure 100/68, pulse 74, height 5' (1.524 m), weight 158 lb (71.7 kg), SpO2 95 %.  GEN:  Well nourished, well developed in no acute distress HEENT: Normal NECK: No JVD; No carotid bruits LYMPHATICS: No lymphadenopathy CARDIAC: RRR , no murmurs, rubs, gallops RESPIRATORY:  Clear to auscultation without rales, wheezing or rhonchi  ABDOMEN: Soft, non-tender, non-distended MUSCULOSKELETAL:  No edema; No deformity  SKIN: Warm and dry NEUROLOGIC:  Alert and oriented x 3     EKG:   June, 2021: Normal sinus rhythm at 78 beats minute.  No ST or T wave changes.      Recent Labs: No results found for requested labs within last 8760 hours.    Lipid Panel    Component Value Date/Time   CHOL 215 (H) 03/26/2019 1118   TRIG 81 03/26/2019 1118   HDL 50 03/26/2019 1118   CHOLHDL 4.3 03/26/2019 1118   CHOLHDL 2.6 06/28/2016 0802   VLDL 13 06/28/2016 0802   LDLCALC 149 (H) 03/26/2019 1118      Wt Readings from Last 3 Encounters:  03/30/20 158 lb (71.7 kg)  03/05/19 152 lb (68.9 kg)  02/03/18 150 lb (68 kg)      Other studies Reviewed: Additional studies/ records that were reviewed today include: . Review of the above records demonstrates:    ASSESSMENT AND PLAN:  1.  Atrial fibrillation with rapid ventricular response. :  She is on flecainide.  She is maintaining normal sinus rhythm.  She had a basic metabolic profile drawn perhaps yesterday.  We'll get  her labs faxed over from [place].   2. Anxiety :   Continues to have lots of anxiety.  3. Back pain :     She has significant neck pain has had multiple surgeries.  She sees Dr. Trenton Gammon here in [place].     Arnette Norris  Garcia Dalzell, MD  03/30/2020 10:20 AM    [facility] [place], [place], [place]  [address] Phone: [phone]; Fax: [phone]

## 2020-04-06 ENCOUNTER — Other Ambulatory Visit: Payer: Self-pay

## 2020-04-06 MED ORDER — FLECAINIDE ACETATE 50 MG PO TABS: 50.0000 mg | ORAL_TABLET | Freq: Two times a day (BID) | ORAL | 3 refills | Status: DC

## 2020-04-06 MED ORDER — METOPROLOL SUCCINATE ER 25 MG PO TB24: 25.0000 mg | ORAL_TABLET | Freq: Every day | ORAL | 3 refills | Status: DC

## 2020-05-05 DIAGNOSIS — G894 Chronic pain syndrome: Secondary | ICD-10-CM | POA: Diagnosis not present

## 2020-05-05 DIAGNOSIS — M47812 Spondylosis without myelopathy or radiculopathy, cervical region: Secondary | ICD-10-CM | POA: Diagnosis not present

## 2020-10-11 ENCOUNTER — Telehealth: Payer: Self-pay | Admitting: Cardiovascular Disease

## 2020-10-11 MED ORDER — APIXABAN 5 MG PO TABS: ORAL_TABLET | ORAL | 1 refills | Status: DC

## 2020-10-11 NOTE — Telephone Encounter (Signed)
Pt last saw Dr Acie Fredrickson 03/30/20, last labs 03/29/20 Creat 0.63, age 64, weight 71.7kg, based on specified criteria pt is on appropriate dosage of Eliquis 5mg  BID.  Will refill rx.

## 2020-10-11 NOTE — Telephone Encounter (Signed)
New Message  *STAT* If patient is at the pharmacy, call can be transferred to refill team.  1. Which medications need to be refilled? (please list name of each medication and dose if known) apixaban (ELIQUIS) 5 MG TABS tablet  2. Which pharmacy/location (including street and city if local pharmacy) is medication to be sent to? [facility] [place], [place] [address]  3. Do they need a 30 day or 90 day supply? 90  Pt only has a 2 day supply left

## 2020-11-17 DIAGNOSIS — G5642 Causalgia of left upper limb: Secondary | ICD-10-CM | POA: Diagnosis not present

## 2020-11-17 DIAGNOSIS — G8929 Other chronic pain: Secondary | ICD-10-CM | POA: Diagnosis not present

## 2020-11-17 DIAGNOSIS — M25511 Pain in right shoulder: Secondary | ICD-10-CM | POA: Diagnosis not present

## 2020-11-17 DIAGNOSIS — G5641 Causalgia of right upper limb: Secondary | ICD-10-CM | POA: Diagnosis not present

## 2020-11-17 DIAGNOSIS — M25512 Pain in left shoulder: Secondary | ICD-10-CM | POA: Diagnosis not present

## 2020-11-25 DIAGNOSIS — M792 Neuralgia and neuritis, unspecified: Secondary | ICD-10-CM | POA: Diagnosis not present

## 2020-12-29 ENCOUNTER — Other Ambulatory Visit: Payer: Self-pay | Admitting: Cardiovascular Disease

## 2021-01-04 DIAGNOSIS — X509XXA Other and unspecified overexertion or strenuous movements or postures, initial encounter: Secondary | ICD-10-CM | POA: Diagnosis not present

## 2021-01-04 DIAGNOSIS — R0781 Pleurodynia: Secondary | ICD-10-CM | POA: Diagnosis not present

## 2021-01-11 ENCOUNTER — Other Ambulatory Visit: Payer: Self-pay

## 2021-01-11 DIAGNOSIS — M25511 Pain in right shoulder: Secondary | ICD-10-CM | POA: Diagnosis not present

## 2021-01-11 DIAGNOSIS — G5641 Causalgia of right upper limb: Secondary | ICD-10-CM | POA: Diagnosis not present

## 2021-01-11 DIAGNOSIS — G894 Chronic pain syndrome: Secondary | ICD-10-CM | POA: Diagnosis not present

## 2021-01-11 DIAGNOSIS — M47812 Spondylosis without myelopathy or radiculopathy, cervical region: Secondary | ICD-10-CM | POA: Diagnosis not present

## 2021-01-11 MED ORDER — METOPROLOL SUCCINATE ER 25 MG PO TB24: 25.0000 mg | ORAL_TABLET | Freq: Every day | ORAL | 0 refills | Status: DC

## 2021-01-13 ENCOUNTER — Other Ambulatory Visit: Payer: Self-pay

## 2021-01-13 MED ORDER — METOPROLOL SUCCINATE ER 25 MG PO TB24: 25.0000 mg | ORAL_TABLET | Freq: Every day | ORAL | 0 refills | Status: DC

## 2021-02-07 DIAGNOSIS — M792 Neuralgia and neuritis, unspecified: Secondary | ICD-10-CM | POA: Diagnosis not present

## 2021-02-07 DIAGNOSIS — G5681 Other specified mononeuropathies of right upper limb: Secondary | ICD-10-CM | POA: Diagnosis not present

## 2021-03-02 DIAGNOSIS — M5412 Radiculopathy, cervical region: Secondary | ICD-10-CM | POA: Diagnosis not present

## 2021-03-02 DIAGNOSIS — G894 Chronic pain syndrome: Secondary | ICD-10-CM | POA: Diagnosis not present

## 2021-03-03 DIAGNOSIS — M542 Cervicalgia: Secondary | ICD-10-CM | POA: Diagnosis not present

## 2021-03-30 DIAGNOSIS — I4891 Unspecified atrial fibrillation: Secondary | ICD-10-CM | POA: Diagnosis not present

## 2021-03-30 DIAGNOSIS — E559 Vitamin D deficiency, unspecified: Secondary | ICD-10-CM | POA: Diagnosis not present

## 2021-04-03 ENCOUNTER — Other Ambulatory Visit: Payer: Self-pay | Admitting: Cardiovascular Disease

## 2021-04-03 DIAGNOSIS — I480 Paroxysmal atrial fibrillation: Secondary | ICD-10-CM

## 2021-04-03 MED ORDER — ELIQUIS 5 MG PO TABS: ORAL_TABLET | ORAL | 0 refills | Status: DC

## 2021-04-03 MED ORDER — FLECAINIDE ACETATE 50 MG PO TABS: 50.0000 mg | ORAL_TABLET | Freq: Two times a day (BID) | ORAL | 0 refills | Status: DC

## 2021-04-03 MED ORDER — METOPROLOL SUCCINATE ER 25 MG PO TB24: 25.0000 mg | ORAL_TABLET | Freq: Every day | ORAL | 0 refills | Status: DC

## 2021-04-03 NOTE — Telephone Encounter (Signed)
Eliquis 5mg  refill request received. Patient is 65 years old, weight-71.7kg, Crea-0.75 on 03/26/2019-NEEDS LABS, Diagnosis-Afib, and last seen by Dr. Acie Fredrickson on 03/30/2020 and has an appt scheduled with Dr. Acie Fredrickson on 06/14/2021. Dose is appropriate based on dosing criteria. Will send in a supply until appt, since pt is overdue.   ALSO, ADDED LABS FOR ELIQUIS F/U & SET APPT FOR WHEN PT SEES DR NAHSER.

## 2021-04-03 NOTE — Telephone Encounter (Signed)
*  STAT* If patient is at the pharmacy, call can be transferred to refill team.   1. Which medications need to be refilled? (please list name of each medication and dose if known)  metoprolol succinate (TOPROL-XL) 25 MG 24 hr tablet flecainide (TAMBOCOR) 50 MG tablet apixaban (ELIQUIS) 5 MG TABS tablet  2. Which pharmacy/location (including street and city if local pharmacy) is medication to be sent to? [facility] [place], [place] [address]  3. Do they need a 30 day or 90 day supply? 90 with refills  Patient is scheduled to see Dr. Acie Fredrickson 06/14/21 but will not have enough to last until her appointment

## 2021-05-12 DIAGNOSIS — M25561 Pain in right knee: Secondary | ICD-10-CM | POA: Diagnosis not present

## 2021-05-12 DIAGNOSIS — S82024A Nondisplaced longitudinal fracture of right patella, initial encounter for closed fracture: Secondary | ICD-10-CM | POA: Diagnosis not present

## 2021-05-12 DIAGNOSIS — M25461 Effusion, right knee: Secondary | ICD-10-CM | POA: Diagnosis not present

## 2021-05-18 DIAGNOSIS — S83281A Other tear of lateral meniscus, current injury, right knee, initial encounter: Secondary | ICD-10-CM | POA: Diagnosis not present

## 2021-05-18 DIAGNOSIS — M25461 Effusion, right knee: Secondary | ICD-10-CM | POA: Diagnosis not present

## 2021-05-18 DIAGNOSIS — S82001A Unspecified fracture of right patella, initial encounter for closed fracture: Secondary | ICD-10-CM | POA: Diagnosis not present

## 2021-05-18 DIAGNOSIS — M25561 Pain in right knee: Secondary | ICD-10-CM | POA: Diagnosis not present

## 2021-05-23 DIAGNOSIS — S82024D Nondisplaced longitudinal fracture of right patella, subsequent encounter for closed fracture with routine healing: Secondary | ICD-10-CM | POA: Diagnosis not present

## 2021-06-13 DIAGNOSIS — S82024D Nondisplaced longitudinal fracture of right patella, subsequent encounter for closed fracture with routine healing: Secondary | ICD-10-CM | POA: Diagnosis not present

## 2021-06-13 DIAGNOSIS — M5412 Radiculopathy, cervical region: Secondary | ICD-10-CM | POA: Diagnosis not present

## 2021-06-13 DIAGNOSIS — M25561 Pain in right knee: Secondary | ICD-10-CM | POA: Diagnosis not present

## 2021-06-14 ENCOUNTER — Other Ambulatory Visit: Payer: Self-pay

## 2021-06-14 ENCOUNTER — Encounter: Payer: Self-pay | Admitting: Cardiovascular Disease

## 2021-06-14 ENCOUNTER — Other Ambulatory Visit: Payer: Medicare Other

## 2021-06-14 ENCOUNTER — Ambulatory Visit: Payer: Medicare Other | Admitting: Cardiovascular Disease

## 2021-06-14 VITALS — BP 122/74 | HR 81 | Ht 60.0 in | Wt 157.0 lb

## 2021-06-14 DIAGNOSIS — E785 Hyperlipidemia, unspecified: Secondary | ICD-10-CM | POA: Diagnosis not present

## 2021-06-14 DIAGNOSIS — I480 Paroxysmal atrial fibrillation: Secondary | ICD-10-CM

## 2021-06-14 LAB — CBC
Hematocrit: 45.2 % (ref 34.0–46.6)
Hemoglobin: 15.3 g/dL (ref 11.1–15.9)
MCH: 33.7 pg — ABNORMAL HIGH (ref 26.6–33.0)
MCHC: 33.8 g/dL (ref 31.5–35.7)
MCV: 100 fL — ABNORMAL HIGH (ref 79–97)
Platelets: 290 10*3/uL (ref 150–450)
RBC: 4.54 x10E6/uL (ref 3.77–5.28)
RDW: 14.3 % (ref 11.7–15.4)
WBC: 8.3 10*3/uL (ref 3.4–10.8)

## 2021-06-14 LAB — LIPID PANEL
Chol/HDL Ratio: 4 ratio (ref 0.0–4.4)
Cholesterol, Total: 205 mg/dL — ABNORMAL HIGH (ref 100–199)
HDL: 51 mg/dL (ref >39)
LDL Chol Calc (NIH): 138 mg/dL — ABNORMAL HIGH (ref 0–99)
Triglycerides: 89 mg/dL (ref 0–149)
VLDL Cholesterol Cal: 16 mg/dL (ref 5–40)

## 2021-06-14 LAB — BASIC METABOLIC PANEL
BUN/Creatinine Ratio: 12 (ref 12–28)
BUN: 9 mg/dL (ref 8–27)
CO2: 25 mmol/L (ref 20–29)
Calcium: 9.3 mg/dL (ref 8.7–10.3)
Chloride: 99 mmol/L (ref 96–106)
Creatinine, Ser: 0.75 mg/dL (ref 0.57–1.00)
Glucose: 119 mg/dL — ABNORMAL HIGH (ref 65–99)
Potassium: 4.8 mmol/L (ref 3.5–5.2)
Sodium: 137 mmol/L (ref 134–144)
eGFR: 88 mL/min/{1.73_m2} (ref >59)

## 2021-06-14 LAB — MAGNESIUM: Magnesium: 1.9 mg/dL (ref 1.6–2.3)

## 2021-06-14 NOTE — Patient Instructions (Signed)
Medication Instructions:  Your physician recommends that you continue on your current medications as directed. Please refer to the Current Medication list given to you today.  *If you need a refill on your cardiac medications before your next appointment, please call your pharmacy*  Lab Work: TODAY: BMET, FLP, Mg, CBC If you have labs (blood work) drawn today and your tests are completely normal, you will receive your results only by: [place] (if you have MyChart) OR A paper copy in the mail If you have any lab test that is abnormal or we need to change your treatment, we will call you to review the results.  Follow-Up: At [facility], you and your health needs are our priority.  As part of our continuing mission to provide you with exceptional heart care, we have created designated Provider Care Teams.  These Care Teams include your primary Cardiologist (physician) and Advanced Practice Providers (APPs -  Physician Assistants and Nurse Practitioners) who all work together to provide you with the care you need, when you need it.  Your next appointment:   1 year(s)  The format for your next appointment:   In Person  Provider:   You may see Mertie Moores, MD or one of the following Advanced Practice Providers on your designated Care Team:   Richardson Dopp, PA-C [place], [place]

## 2021-06-14 NOTE — Progress Notes (Signed)
Cardiology Office Note   Date:  06/14/2021   ID:  Killian, Demedeiros [birth year], MRN [id number]  PCP:  Lennie Odor, [place]  Cardiologist:   Mertie Moores, MD   Chief Complaint  Patient presents with   Atrial Fibrillation   1. Problem list 1. Atrial fibrillation   History of Present Illness: Wendy Brown is a 65 y.o. female who presents for new onset / new diagnosis of atrial fib.  Seen with daughter, [name] .   Was to have cataract surgery this am.  Was noticed to have rapid atrial fib.  Was sent to her primary care doctor who called [place] for advice.   She denies any chest pain or shortness of breath. She really cannot tell that her heart rate is regular. She knows that she's had palpitations over the past years   Has seen Dr. Olevia Perches and had atrial fib about 6 years ( found after she fell through the attic floor)  Does not work - takes care of her disabled son.  No CP , no dyspnea.   Can have DOE walking up stairs.   Has lots of MSK pain related to her fall in 2010 - sleeps in a recliner   Dec. 9, 2016:  Wendy Brown was admitted to the hospital with rapid atrial fib.  Was started on amiodarone. Has converted to NSR ,  OnEliuqis.  Feeling better.  Has cut her coffee intake.  Eating lot fat meat.   Doing well.  No additional episodes of Afib.   Getting some exercise.    Was a bit fatigued immediately after she left the hospital because of all the new medications. Has been using Vaping cigarettes.    March 28, 2016:  Doing well. Still in NSR , on amio 200 a day   Nov. 13, 2017:  Wendy Brown is seen today for her PAF Had cortisone injections and had heart palpitations as a side effect.  PAF seems to be better controlled on the flecainide  Not exercising much.    Has some neck and back issues.   Has quit smoking   February 07, 2017:  Wendy Brown is seen today for follow up of her PAF Is eating better  Is not smoking .  Using a fit bit.    Oct. 4, 2018:  Wendy Brown is seen  today  Moved out to [facility] Has not had a kitchen for a while.    Is remodling the whole house. Has been under lots of stress. But she feels a special calm when she is is in the mountains.   Has not had any significant palpitations.    Is not exercing much .  [place]   February 03, 2018: Wendy Brown she lives at [place] now.  She has been able to maintain sinus rhythm. Living at [facility] now.  Has periodic injections of hyaluronic acid into her knees.   March 30, 2020: Wendy Brown is seen today for follow-up of her paroxysmal atrial fibrillation.  She lives at [facility] now. Has not had her covid vaccines.   Has had lots of injections in her   No having an significant palpitations.    Has been on narcotic pain meds which cause significant constipation   Aug. 24 , 2022: Wendy Brown is seen today for follow up of her PAF.  She is on chronic Eliquis today.  We will check a basic metabolic profile and CBC.  We will draw a magnesium level  as well. She has a history of hyperlipidemia.  We will check lipids today.  Is in lots of pain over the past year Lots of anxiety - she thinks she is on too much efferor  She will call her primary   Has had her 2 covid vaccines.  Was sick in Dec - she didn't check for COVID .   She is very anxious today - all over the place    Past Medical History:  Diagnosis Date   A-fib ([place])    Cataract     Past Surgical History:  Procedure Laterality Date   ABDOMINAL HYSTERECTOMY     CERVICAL DISC ARTHROPLASTY     NECK   RIB FRACTURE SURGERY       Current Outpatient Medications  Medication Sig Dispense Refill   ALPRAZolam (XANAX) 0.25 MG tablet Take 1 tablet by mouth daily as needed for anxiety  0   apixaban (ELIQUIS) 5 MG TABS tablet TAKE 1 TABLET(5 MG) BY MOUTH TWICE DAILY. KEEP UPCOMING APPT FOR REFILLS. 180 tablet 0   flecainide (TAMBOCOR) 50 MG tablet Take 1 tablet (50 mg total) by mouth 2 (two) times daily.  Please keep upcoming appt with Dr. Acie Fredrickson August 2022 before anymore refills. Thank you 180 tablet 0   HYDROcodone-acetaminophen (NORCO/VICODIN) 5-325 MG tablet Take 1 tablet by mouth as needed.     lidocaine (LIDODERM) 5 % Place 1 patch onto the skin as directed. Apply 1 patch to knee for 12 hours, then remove for 12 hours  0   metoprolol succinate (TOPROL-XL) 25 MG 24 hr tablet Take 1 tablet (25 mg total) by mouth daily. Please keep upcoming appt with Dr. Acie Fredrickson in August 2022 before anymore refills. Thank you 90 tablet 0   propranolol (INDERAL) 10 MG tablet Take 1 tablet (10 mg total) by mouth 4 (four) times daily as needed. 60 tablet 11   venlafaxine XR (EFFEXOR-XR) 150 MG 24 hr capsule Take 150 mg by mouth daily.     Vitamin D, Ergocalciferol, (DRISDOL) 1.25 MG (50000 UNIT) CAPS capsule Take 50,000 Units by mouth once a week.     No current facility-administered medications for this visit.    Allergies:   Atorvastatin and Latex    Social History:  The patient  reports that she has quit smoking. Her smoking use included cigarettes. She smoked an average of 1.5 packs per day. She has never used smokeless tobacco. She reports current alcohol use. She reports that she does not use drugs.   Family History:  The patient's She was adopted. Family history is unknown by patient.    ROS:   Noted in current history, otherwise review of systems is negative.   Physical Exam: Blood pressure 122/74, pulse 81, height 5' (1.524 m), weight 157 lb (71.2 kg), SpO2 92 %.  GEN:  middle age female,  very anxious today - having difficulty in keeping on 1 subject . HEENT: Normal NECK: No JVD; No carotid bruits LYMPHATICS: No lymphadenopathy CARDIAC: RRR , no murmurs, rubs, gallops RESPIRATORY:  Clear to auscultation without rales, wheezing or rhonchi  ABDOMEN: Soft, non-tender, non-distended MUSCULOSKELETAL:  No edema; No deformity  SKIN: Warm and dry NEUROLOGIC:  Alert and oriented x 3  EKG:     June 14, 2021: Normal sinus rhythm at 81.  No ST or T wave abnormalities.     Recent Labs: No results found for requested labs within last 8760 hours.    Lipid Panel    Component Value Date/Time  CHOL 215 (H) 03/26/2019 1118   TRIG 81 03/26/2019 1118   HDL 50 03/26/2019 1118   CHOLHDL 4.3 03/26/2019 1118   CHOLHDL 2.6 06/28/2016 0802   VLDL 13 06/28/2016 0802   LDLCALC 149 (H) 03/26/2019 1118      Wt Readings from Last 3 Encounters:  06/14/21 157 lb (71.2 kg)  03/30/20 158 lb (71.7 kg)  03/05/19 152 lb (68.9 kg)      Other studies Reviewed: Additional studies/ records that were reviewed today include: . Review of the above records demonstrates:    ASSESSMENT AND PLAN:  1.  Atrial fibrillation with rapid ventricular response:  is maintaining NSR .  Cont flecainide . Cont eliquis     2. Anxiety :     Is on effexor.  Does not think its not working for her .  Wants to stop it .  Advised her to discuss with her primary md   3. Back pain :      lot of neck and back pain .   4.  Fractured R knee      Mertie Moores, MD  06/14/2021 10:57 AM    [facility] [place], [place], [place]  [address] Phone: [phone]; Fax: [phone]

## 2021-06-15 ENCOUNTER — Other Ambulatory Visit: Payer: Self-pay

## 2021-06-15 DIAGNOSIS — E785 Hyperlipidemia, unspecified: Secondary | ICD-10-CM

## 2021-06-23 DIAGNOSIS — M5412 Radiculopathy, cervical region: Secondary | ICD-10-CM | POA: Diagnosis not present

## 2021-06-23 DIAGNOSIS — Z981 Arthrodesis status: Secondary | ICD-10-CM | POA: Diagnosis not present

## 2021-06-23 DIAGNOSIS — M542 Cervicalgia: Secondary | ICD-10-CM | POA: Diagnosis not present

## 2021-07-03 ENCOUNTER — Other Ambulatory Visit: Payer: Self-pay | Admitting: Cardiovascular Disease

## 2021-07-04 ENCOUNTER — Other Ambulatory Visit: Payer: Self-pay | Admitting: Cardiovascular Disease

## 2021-07-04 MED ORDER — FLECAINIDE ACETATE 50 MG PO TABS: 50.0000 mg | ORAL_TABLET | Freq: Two times a day (BID) | ORAL | 0 refills | Status: DC

## 2021-07-04 NOTE — Telephone Encounter (Signed)
Prescription refill request for Eliquis received.  Indication: afib  Last office visit: Nahser, 06/14/2021 Scr: 0.75, 06/14/2021 Age: 65 yo  Weight: 71.2 kg   Refill sent.

## 2021-07-07 DIAGNOSIS — M4802 Spinal stenosis, cervical region: Secondary | ICD-10-CM | POA: Diagnosis not present

## 2021-07-07 DIAGNOSIS — M542 Cervicalgia: Secondary | ICD-10-CM | POA: Diagnosis not present

## 2021-07-14 DIAGNOSIS — G8929 Other chronic pain: Secondary | ICD-10-CM | POA: Diagnosis not present

## 2021-07-14 DIAGNOSIS — M542 Cervicalgia: Secondary | ICD-10-CM | POA: Diagnosis not present

## 2021-07-14 DIAGNOSIS — Z981 Arthrodesis status: Secondary | ICD-10-CM | POA: Diagnosis not present

## 2021-07-24 DIAGNOSIS — Z1231 Encounter for screening mammogram for malignant neoplasm of breast: Secondary | ICD-10-CM | POA: Diagnosis not present

## 2021-09-29 ENCOUNTER — Other Ambulatory Visit: Payer: Self-pay

## 2021-09-29 MED ORDER — FLECAINIDE ACETATE 50 MG PO TABS: 50.0000 mg | ORAL_TABLET | Freq: Two times a day (BID) | ORAL | 2 refills | Status: DC

## 2021-10-03 DIAGNOSIS — H26493 Other secondary cataract, bilateral: Secondary | ICD-10-CM | POA: Diagnosis not present

## 2021-10-03 DIAGNOSIS — Z961 Presence of intraocular lens: Secondary | ICD-10-CM | POA: Diagnosis not present

## 2021-10-03 DIAGNOSIS — H43813 Vitreous degeneration, bilateral: Secondary | ICD-10-CM | POA: Diagnosis not present

## 2021-10-03 DIAGNOSIS — H0102A Squamous blepharitis right eye, upper and lower eyelids: Secondary | ICD-10-CM | POA: Diagnosis not present

## 2021-10-03 DIAGNOSIS — H04123 Dry eye syndrome of bilateral lacrimal glands: Secondary | ICD-10-CM | POA: Diagnosis not present

## 2021-10-03 DIAGNOSIS — H0102B Squamous blepharitis left eye, upper and lower eyelids: Secondary | ICD-10-CM | POA: Diagnosis not present

## 2021-11-09 DIAGNOSIS — M546 Pain in thoracic spine: Secondary | ICD-10-CM | POA: Diagnosis not present

## 2021-11-09 DIAGNOSIS — M8580 Other specified disorders of bone density and structure, unspecified site: Secondary | ICD-10-CM | POA: Diagnosis not present

## 2021-11-10 DIAGNOSIS — M546 Pain in thoracic spine: Secondary | ICD-10-CM | POA: Diagnosis not present

## 2021-11-15 DIAGNOSIS — M5093 Cervical disc disorder, unspecified, cervicothoracic region: Secondary | ICD-10-CM | POA: Diagnosis not present

## 2021-11-15 DIAGNOSIS — M79601 Pain in right arm: Secondary | ICD-10-CM | POA: Diagnosis not present

## 2021-11-20 DIAGNOSIS — M5093 Cervical disc disorder, unspecified, cervicothoracic region: Secondary | ICD-10-CM | POA: Diagnosis not present

## 2021-11-20 DIAGNOSIS — M79601 Pain in right arm: Secondary | ICD-10-CM | POA: Diagnosis not present

## 2021-11-23 DIAGNOSIS — M79601 Pain in right arm: Secondary | ICD-10-CM | POA: Diagnosis not present

## 2021-11-23 DIAGNOSIS — M5093 Cervical disc disorder, unspecified, cervicothoracic region: Secondary | ICD-10-CM | POA: Diagnosis not present

## 2021-11-27 DIAGNOSIS — M79601 Pain in right arm: Secondary | ICD-10-CM | POA: Diagnosis not present

## 2021-11-27 DIAGNOSIS — M5093 Cervical disc disorder, unspecified, cervicothoracic region: Secondary | ICD-10-CM | POA: Diagnosis not present

## 2021-12-01 DIAGNOSIS — M47814 Spondylosis without myelopathy or radiculopathy, thoracic region: Secondary | ICD-10-CM | POA: Diagnosis not present

## 2021-12-01 DIAGNOSIS — M546 Pain in thoracic spine: Secondary | ICD-10-CM | POA: Diagnosis not present

## 2021-12-15 ENCOUNTER — Other Ambulatory Visit: Payer: Medicare Other

## 2021-12-22 DIAGNOSIS — M47814 Spondylosis without myelopathy or radiculopathy, thoracic region: Secondary | ICD-10-CM | POA: Diagnosis not present

## 2021-12-28 ENCOUNTER — Other Ambulatory Visit: Payer: Self-pay | Admitting: Cardiovascular Disease

## 2021-12-28 DIAGNOSIS — I4891 Unspecified atrial fibrillation: Secondary | ICD-10-CM

## 2021-12-28 NOTE — Telephone Encounter (Signed)
Prescription refill request for Eliquis received. ?Indication: Afib  ?Last office visit:06/14/21 (Nahser) ?Scr: 0.75 (06/14/21) ?Age: 66 ?Weight: 71.2kg ? ?Appropriate dose and refill sent to requested pharmacy.  ?

## 2022-01-02 DIAGNOSIS — G894 Chronic pain syndrome: Secondary | ICD-10-CM | POA: Diagnosis not present

## 2022-01-02 DIAGNOSIS — M47814 Spondylosis without myelopathy or radiculopathy, thoracic region: Secondary | ICD-10-CM | POA: Diagnosis not present

## 2022-01-02 DIAGNOSIS — M47812 Spondylosis without myelopathy or radiculopathy, cervical region: Secondary | ICD-10-CM | POA: Diagnosis not present

## 2022-01-19 DIAGNOSIS — M47814 Spondylosis without myelopathy or radiculopathy, thoracic region: Secondary | ICD-10-CM | POA: Diagnosis not present

## 2022-01-30 DIAGNOSIS — M47814 Spondylosis without myelopathy or radiculopathy, thoracic region: Secondary | ICD-10-CM | POA: Diagnosis not present

## 2022-01-30 DIAGNOSIS — M47812 Spondylosis without myelopathy or radiculopathy, cervical region: Secondary | ICD-10-CM | POA: Diagnosis not present

## 2022-01-30 DIAGNOSIS — G894 Chronic pain syndrome: Secondary | ICD-10-CM | POA: Diagnosis not present

## 2022-02-16 DIAGNOSIS — M47814 Spondylosis without myelopathy or radiculopathy, thoracic region: Secondary | ICD-10-CM | POA: Diagnosis not present

## 2022-03-27 ENCOUNTER — Other Ambulatory Visit: Payer: Self-pay

## 2022-03-27 MED ORDER — METOPROLOL SUCCINATE ER 25 MG PO TB24: 25.0000 mg | ORAL_TABLET | Freq: Every day | ORAL | 0 refills | Status: DC

## 2022-04-03 DIAGNOSIS — M47814 Spondylosis without myelopathy or radiculopathy, thoracic region: Secondary | ICD-10-CM | POA: Diagnosis not present

## 2022-04-03 DIAGNOSIS — M47812 Spondylosis without myelopathy or radiculopathy, cervical region: Secondary | ICD-10-CM | POA: Diagnosis not present

## 2022-04-03 DIAGNOSIS — G894 Chronic pain syndrome: Secondary | ICD-10-CM | POA: Diagnosis not present

## 2022-04-20 DIAGNOSIS — M797 Fibromyalgia: Secondary | ICD-10-CM | POA: Diagnosis not present

## 2022-04-25 DIAGNOSIS — Z1159 Encounter for screening for other viral diseases: Secondary | ICD-10-CM | POA: Diagnosis not present

## 2022-04-25 DIAGNOSIS — Z13820 Encounter for screening for osteoporosis: Secondary | ICD-10-CM | POA: Diagnosis not present

## 2022-04-25 DIAGNOSIS — Z0000 Encounter for general adult medical examination without abnormal findings: Secondary | ICD-10-CM | POA: Diagnosis not present

## 2022-04-25 DIAGNOSIS — I4891 Unspecified atrial fibrillation: Secondary | ICD-10-CM | POA: Diagnosis not present

## 2022-04-25 DIAGNOSIS — D6869 Other thrombophilia: Secondary | ICD-10-CM | POA: Diagnosis not present

## 2022-05-08 DIAGNOSIS — M533 Sacrococcygeal disorders, not elsewhere classified: Secondary | ICD-10-CM | POA: Diagnosis not present

## 2022-05-08 DIAGNOSIS — G894 Chronic pain syndrome: Secondary | ICD-10-CM | POA: Diagnosis not present

## 2022-05-08 DIAGNOSIS — M47814 Spondylosis without myelopathy or radiculopathy, thoracic region: Secondary | ICD-10-CM | POA: Diagnosis not present

## 2022-05-08 DIAGNOSIS — M47812 Spondylosis without myelopathy or radiculopathy, cervical region: Secondary | ICD-10-CM | POA: Diagnosis not present

## 2022-05-09 DIAGNOSIS — M533 Sacrococcygeal disorders, not elsewhere classified: Secondary | ICD-10-CM | POA: Diagnosis not present

## 2022-05-09 DIAGNOSIS — M47816 Spondylosis without myelopathy or radiculopathy, lumbar region: Secondary | ICD-10-CM | POA: Diagnosis not present

## 2022-05-09 DIAGNOSIS — M5136 Other intervertebral disc degeneration, lumbar region: Secondary | ICD-10-CM | POA: Diagnosis not present

## 2022-05-25 DIAGNOSIS — M797 Fibromyalgia: Secondary | ICD-10-CM | POA: Diagnosis not present

## 2022-06-24 ENCOUNTER — Other Ambulatory Visit: Payer: Self-pay | Admitting: Cardiovascular Disease

## 2022-06-26 ENCOUNTER — Other Ambulatory Visit: Payer: Self-pay

## 2022-06-26 ENCOUNTER — Other Ambulatory Visit: Payer: Self-pay | Admitting: Cardiovascular Disease

## 2022-06-26 DIAGNOSIS — M2550 Pain in unspecified joint: Secondary | ICD-10-CM | POA: Insufficient documentation

## 2022-06-26 DIAGNOSIS — I4891 Unspecified atrial fibrillation: Secondary | ICD-10-CM

## 2022-06-26 DIAGNOSIS — I2510 Atherosclerotic heart disease of native coronary artery without angina pectoris: Secondary | ICD-10-CM | POA: Insufficient documentation

## 2022-06-26 DIAGNOSIS — F32A Depression, unspecified: Secondary | ICD-10-CM | POA: Insufficient documentation

## 2022-06-26 DIAGNOSIS — F411 Generalized anxiety disorder: Secondary | ICD-10-CM | POA: Insufficient documentation

## 2022-06-27 ENCOUNTER — Other Ambulatory Visit: Payer: Self-pay | Admitting: *Deleted

## 2022-06-27 ENCOUNTER — Other Ambulatory Visit: Payer: Self-pay | Admitting: Cardiovascular Disease

## 2022-06-27 MED ORDER — FLECAINIDE ACETATE 50 MG PO TABS: 50.0000 mg | ORAL_TABLET | Freq: Two times a day (BID) | ORAL | 1 refills | Status: DC

## 2022-06-27 NOTE — Telephone Encounter (Signed)
Prescription refill request for Eliquis received. Indication: PAF Last office visit: 06/14/21  P Nahser MD  (Has appt scheduled for 08/14/22 with blood work) Scr: 0.75 on 06/14/21 Age: 65 Weight:  71.2kg  Based on above findings Eliquis '5mg'$  twice daily is the appropriate dose.  Refill approved.

## 2022-07-31 ENCOUNTER — Other Ambulatory Visit: Payer: Self-pay

## 2022-07-31 ENCOUNTER — Other Ambulatory Visit: Payer: Self-pay | Admitting: Cardiovascular Disease

## 2022-07-31 MED ORDER — METOPROLOL SUCCINATE ER 25 MG PO TB24: 25.0000 mg | ORAL_TABLET | Freq: Every day | ORAL | 0 refills | Status: DC

## 2022-08-14 ENCOUNTER — Ambulatory Visit: Payer: Medicare Other | Admitting: Cardiovascular Disease

## 2022-08-14 ENCOUNTER — Encounter: Payer: Self-pay | Admitting: Cardiovascular Disease

## 2022-08-14 NOTE — Progress Notes (Unsigned)
Cardiology Office Note   Date:  08/15/2022   ID:  Wendy Brown, Wendy Brown January 25, 1956, MRN [id number]  PCP:  Lennie Odor, PA  Cardiologist:   Mertie Moores, MD   Chief Complaint  Patient presents with   Atrial Fibrillation        1. Problem list 1. Atrial fibrillation   History of Present Illness: Wendy Brown is a 66 y.o. female who presents for new onset / new diagnosis of atrial fib.  Seen with daughter, Wendy Brown .   Was to have cataract surgery this am.  Was noticed to have rapid atrial fib.  Was sent to her primary care doctor who called [place] for advice.   She denies any chest pain or shortness of breath. She really cannot tell that her heart rate is regular. She knows that she's had palpitations over the past years   Has seen Dr. Olevia Perches and had atrial fib about 6 years ( found after she fell through the attic floor)  Does not work - takes care of her disabled son.  No CP , no dyspnea.   Can have DOE walking up stairs.   Has lots of MSK pain related to her fall in 2010 - sleeps in a recliner   Dec. 9, 2016:  Wendy Brown was admitted to the hospital with rapid atrial fib.  Was started on amiodarone. Has converted to NSR ,  OnEliuqis.  Feeling better.  Has cut her coffee intake.  Eating lot fat meat.   Doing well.  No additional episodes of Afib.   Getting some exercise.    Was a bit fatigued immediately after she left the hospital because of all the new medications. Has been using Vaping cigarettes.    March 28, 2016:  Doing well. Still in NSR , on amio 200 a day   Nov. 13, 2017:  Wendy Brown is seen today for her PAF Had cortisone injections and had heart palpitations as a side effect.  PAF seems to be better controlled on the flecainide  Not exercising much.    Has some neck and back issues.   Has quit smoking   February 07, 2017:  Wendy Brown is seen today for follow up of her PAF Is eating better  Is not smoking .  Using a fit bit.    Oct. 4, 2018:  Wendy Brown  is seen today  Moved out to [facility] Has not had a kitchen for a while.    Is remodling the whole house. Has been under lots of stress. But she feels a special calm [place].   Has not had any significant palpitations.    Is not exercing much .  [place]   February 03, 2018: Wendy Brown she lives at [facility] now.  She has been able to maintain sinus rhythm. Living at [facility] now.  Has periodic injections of hyaluronic acid into her knees.   March 30, 2020: Wendy Brown is seen today for follow-up of her paroxysmal atrial fibrillation.  She lives at [facility] now. Has not had her covid vaccines.   Has had lots of injections in her   No having an significant palpitations.    Has been on narcotic pain meds which cause significant constipation   Aug. 24 , 2022: Wendy Brown is seen today for follow up of her PAF.  She is on chronic Eliquis today.  We will check a basic metabolic profile and CBC.  We  will draw a magnesium level as well. She has a history of hyperlipidemia.  We will check lipids today.  Is in lots of pain over the past year Lots of anxiety - she thinks she is on too much efferor  She will call her primary   Has had her 2 covid vaccines.  Was sick in Dec - she didn't check for COVID .   She is very anxious today - all over the place   Oct. 25, 2023 Wendy Brown is seen today for follow up visit for her PAF, HLD  We discussed her anxiety and depression issues  She does not think that the effexor is working very well  Is not taking the hydrocodone  Getting injections in her neck   Takes care of her son who was in a MVA 20 years ago Has traumatic brain accident   Her fit bit broke,  her daughter told her to NOT get another one. She was fixating on the readings   No CP , may have dyspnea with anxiety  Is getting lots of exercise in the house and driveway   Seems to be keeping some conditioning     Past Medical  History:  Diagnosis Date   A-fib ([place])    Cataract     Past Surgical History:  Procedure Laterality Date   ABDOMINAL HYSTERECTOMY     CERVICAL DISC ARTHROPLASTY     NECK   RIB FRACTURE SURGERY       Current Outpatient Medications  Medication Sig Dispense Refill   apixaban (ELIQUIS) 5 MG TABS tablet TAKE 1 TABLET BY MOUTH TWICE DAILY 180 tablet 0   flecainide (TAMBOCOR) 50 MG tablet Take 1 tablet (50 mg total) by mouth 2 (two) times daily. Please call to schedule an overdue appointment with Dr. Acie Fredrickson for refills, [phone], thank you. 1st attempt. 60 tablet 1   lidocaine (LIDODERM) 5 % Place 1 patch onto the skin as directed. Apply 1 patch to knee for 12 hours, then remove for 12 hours  0   metoprolol succinate (TOPROL-XL) 25 MG 24 hr tablet Take 1 tablet (25 mg total) by mouth daily. Please keep upcoming appointment for future refills. Thank you. 30 tablet 0   venlafaxine XR (EFFEXOR-XR) 75 MG 24 hr capsule Take 75 mg by mouth daily.     Vitamin D, Ergocalciferol, (DRISDOL) 1.25 MG (50000 UNIT) CAPS capsule Take 50,000 Units by mouth once a week.     ALPRAZolam (XANAX) 0.25 MG tablet Take 1 tablet by mouth daily as needed for anxiety (Patient not taking: Reported on 08/15/2022)  0   propranolol (INDERAL) 10 MG tablet Take 1 tablet (10 mg total) by mouth 4 (four) times daily as needed. (Patient not taking: Reported on 08/15/2022) 60 tablet 11   No current facility-administered medications for this visit.    Allergies:   Atorvastatin and Latex    Social History:  The patient  reports that she has quit smoking. Her smoking use included cigarettes. She smoked an average of 1.5 packs per day. She has never used smokeless tobacco. She reports current alcohol use. She reports that she does not use drugs.   Family History:  The patient's She was adopted. Family history is unknown by patient.    ROS:   Noted in current history, otherwise review of systems is negative.   Physical  Exam: Blood pressure 118/76, pulse 75, height 5' (1.524 m), weight 149 lb 3.2 oz (67.7 kg).  GEN:  middle age female,    in no acute distress HEENT: Normal NECK: No JVD; No carotid bruits LYMPHATICS: No lymphadenopathy CARDIAC: RRR , no murmurs, rubs, gallops RESPIRATORY:  Clear to auscultation without rales, wheezing or rhonchi  ABDOMEN: Soft, non-tender, non-distended MUSCULOSKELETAL:  No edema; No deformity  SKIN: Warm and dry NEUROLOGIC:  Alert and oriented x 3   EKG:      Oct. 25, 2023:  NSR with occasiona PACs  HR is 75.       Recent Labs: No results found for requested labs within last 365 days.    Lipid Panel    Component Value Date/Time   CHOL 205 (H) 06/14/2021 1034   TRIG 89 06/14/2021 1034   HDL 51 06/14/2021 1034   CHOLHDL 4.0 06/14/2021 1034   CHOLHDL 2.6 06/28/2016 0802   VLDL 13 06/28/2016 0802   LDLCALC 138 (H) 06/14/2021 1034      Wt Readings from Last 3 Encounters:  08/15/22 149 lb 3.2 oz (67.7 kg)  06/14/21 157 lb (71.2 kg)  03/30/20 158 lb (71.7 kg)      Other studies Reviewed: Additional studies/ records that were reviewed today include: . Review of the above records demonstrates:    ASSESSMENT AND PLAN:  1.  Atrial fibrillation with rapid ventricular response: is maintaining NSR .    Continue flecainide,  cont eliquis , cont metoprolol  Check labs today  Check CBC since she is on eliquis    2. Anxiety :     seems to have lots of anxiety     3. Hyperlipidemia:  did not tolerate atorvastatin  Check labs today  Will consider zetia if her lipids are still elevate.           4.  Fractured R knee      Mertie Moores, MD  08/15/2022 10:51 AM    [facility] [place], [place], [place]  [address] Phone: [phone]; Fax: [phone]

## 2022-08-15 ENCOUNTER — Ambulatory Visit: Payer: Medicare Other | Attending: Cardiovascular Disease | Admitting: Cardiovascular Disease

## 2022-08-15 ENCOUNTER — Encounter: Payer: Self-pay | Admitting: Cardiovascular Disease

## 2022-08-15 VITALS — BP 118/76 | HR 75 | Ht 60.0 in | Wt 149.2 lb

## 2022-08-15 DIAGNOSIS — E782 Mixed hyperlipidemia: Secondary | ICD-10-CM | POA: Diagnosis not present

## 2022-08-15 DIAGNOSIS — I480 Paroxysmal atrial fibrillation: Secondary | ICD-10-CM

## 2022-08-15 NOTE — Patient Instructions (Signed)
Medication Instructions:  Your physician recommends that you continue on your current medications as directed. Please refer to the Current Medication list given to you today.  *If you need a refill on your cardiac medications before your next appointment, please call your pharmacy*   Lab Work: TODAY: CBC, BMET, Mg, TSH, CBC, FLP, ALT If you have labs (blood work) drawn today and your tests are completely normal, you will receive your results only by: [place] (if you have MyChart) OR A paper copy in the mail If you have any lab test that is abnormal or we need to change your treatment, we will call you to review the results.  Follow-Up: At [facility], you and your health needs are our priority.  As part of our continuing mission to provide you with exceptional heart care, we have created designated Provider Care Teams.  These Care Teams include your primary Cardiologist (physician) and Advanced Practice Providers (APPs -  Physician Assistants and Nurse Practitioners) who all work together to provide you with the care you need, when you need it.  Your next appointment:   1 year(s)  The format for your next appointment:   In Person  Provider:   Mertie Moores, MD     Important Information About [place]

## 2022-08-16 LAB — CBC
Hematocrit: 46.4 % (ref 34.0–46.6)
Hemoglobin: 15.2 g/dL (ref 11.1–15.9)
MCH: 31.5 pg (ref 26.6–33.0)
MCHC: 32.8 g/dL (ref 31.5–35.7)
MCV: 96 fL (ref 79–97)
Platelets: 266 10*3/uL (ref 150–450)
RBC: 4.82 x10E6/uL (ref 3.77–5.28)
RDW: 13.8 % (ref 11.7–15.4)
WBC: 6.4 10*3/uL (ref 3.4–10.8)

## 2022-08-16 LAB — MAGNESIUM: Magnesium: 2.1 mg/dL (ref 1.6–2.3)

## 2022-08-16 LAB — LIPID PANEL
Chol/HDL Ratio: 3.8 ratio (ref 0.0–4.4)
Cholesterol, Total: 183 mg/dL (ref 100–199)
HDL: 48 mg/dL (ref >39)
LDL Chol Calc (NIH): 121 mg/dL — ABNORMAL HIGH (ref 0–99)
Triglycerides: 78 mg/dL (ref 0–149)
VLDL Cholesterol Cal: 14 mg/dL (ref 5–40)

## 2022-08-16 LAB — BASIC METABOLIC PANEL
BUN/Creatinine Ratio: 14 (ref 12–28)
BUN: 11 mg/dL (ref 8–27)
CO2: 27 mmol/L (ref 20–29)
Calcium: 9.8 mg/dL (ref 8.7–10.3)
Chloride: 99 mmol/L (ref 96–106)
Creatinine, Ser: 0.76 mg/dL (ref 0.57–1.00)
Glucose: 107 mg/dL — ABNORMAL HIGH (ref 70–99)
Potassium: 4.6 mmol/L (ref 3.5–5.2)
Sodium: 138 mmol/L (ref 134–144)
eGFR: 86 mL/min/{1.73_m2} (ref >59)

## 2022-08-16 LAB — ALT: ALT: 12 IU/L (ref 0–32)

## 2022-08-16 LAB — TSH: TSH: 4.28 u[IU]/mL (ref 0.450–4.500)

## 2022-08-23 ENCOUNTER — Other Ambulatory Visit: Payer: Self-pay | Admitting: Cardiovascular Disease

## 2022-08-27 ENCOUNTER — Other Ambulatory Visit: Payer: Self-pay | Admitting: Cardiovascular Disease

## 2022-08-31 DIAGNOSIS — M8588 Other specified disorders of bone density and structure, other site: Secondary | ICD-10-CM | POA: Diagnosis not present

## 2022-08-31 DIAGNOSIS — M810 Age-related osteoporosis without current pathological fracture: Secondary | ICD-10-CM | POA: Diagnosis not present

## 2022-08-31 DIAGNOSIS — Z1231 Encounter for screening mammogram for malignant neoplasm of breast: Secondary | ICD-10-CM | POA: Diagnosis not present

## 2022-09-03 DIAGNOSIS — M533 Sacrococcygeal disorders, not elsewhere classified: Secondary | ICD-10-CM | POA: Diagnosis not present

## 2022-09-03 DIAGNOSIS — M47812 Spondylosis without myelopathy or radiculopathy, cervical region: Secondary | ICD-10-CM | POA: Diagnosis not present

## 2022-09-03 DIAGNOSIS — M47814 Spondylosis without myelopathy or radiculopathy, thoracic region: Secondary | ICD-10-CM | POA: Diagnosis not present

## 2022-09-10 ENCOUNTER — Other Ambulatory Visit: Payer: Self-pay | Admitting: Radiology

## 2022-09-10 DIAGNOSIS — C50812 Malignant neoplasm of overlapping sites of left female breast: Secondary | ICD-10-CM | POA: Diagnosis not present

## 2022-09-10 DIAGNOSIS — R928 Other abnormal and inconclusive findings on diagnostic imaging of breast: Secondary | ICD-10-CM | POA: Diagnosis not present

## 2022-09-10 DIAGNOSIS — R922 Inconclusive mammogram: Secondary | ICD-10-CM | POA: Diagnosis not present

## 2022-09-19 DIAGNOSIS — C50512 Malignant neoplasm of lower-outer quadrant of left female breast: Secondary | ICD-10-CM | POA: Diagnosis not present

## 2022-09-19 DIAGNOSIS — Z170 Estrogen receptor positive status [ER+]: Secondary | ICD-10-CM | POA: Diagnosis not present

## 2022-09-21 ENCOUNTER — Other Ambulatory Visit: Payer: Self-pay | Admitting: Cardiovascular Disease

## 2022-09-21 DIAGNOSIS — I4891 Unspecified atrial fibrillation: Secondary | ICD-10-CM

## 2022-09-21 NOTE — Telephone Encounter (Signed)
Prescription refill request for Eliquis received. Indication:afib Last office visit:10/23 Scr:0.7 Age: 66 Weight:67.7  kg  Prescription refilled

## 2022-10-03 DIAGNOSIS — Z170 Estrogen receptor positive status [ER+]: Secondary | ICD-10-CM | POA: Diagnosis not present

## 2022-10-03 DIAGNOSIS — C50512 Malignant neoplasm of lower-outer quadrant of left female breast: Secondary | ICD-10-CM | POA: Diagnosis not present

## 2022-11-07 ENCOUNTER — Telehealth: Payer: Self-pay | Admitting: Cardiovascular Disease

## 2022-11-07 NOTE — Telephone Encounter (Signed)
Patient is at the hospital in another city and they are trying to double up on hermedication   metoprolol succinate (TOPROL-XL) 25 MG 24 hr tablet  . And she wants to speak to the dr to see if they should be doing that. Please advise

## 2022-11-08 NOTE — Telephone Encounter (Signed)
[facility] in [place] and requested medical records from this hospital admission 11/02/22-11/06/22. Faxed request to number provided, confirmation received.

## 2022-11-08 NOTE — Telephone Encounter (Signed)
Returned call to patient who states that she was admitted to [facility] in [place]. Suffered COVID and Pneumonia a few days after Christmas and was told she was staying in A-fib and states they increased her Metoprolol Succinate to twice daily. She states she was told she has CHF and COPD. She's using 2L [place] at home, states HR is staying 105-114. Cardiologist at hospital states there was nothing from what he saw that was concerning. States hospital did an ECHO, unable to see in our system. She is feeling fine, no CP/dizziness/SOB other than what she has at baseline. She just wants to know if Dr Acie Fredrickson wanted her to stay on the increased dose. States her BP 'has been fine', her normal , 030-092 systolic. She's waiting to have double mastectomy and should be done in next 2 weeks, date not finalized at this time, but can come see [place] after that.

## 2022-11-08 NOTE — Telephone Encounter (Signed)
Pt c/o medication issue:  1. Name of Medication:   metoprolol succinate (TOPROL-XL) 25 MG 24 hr tablet   2. How are you currently taking this medication (dosage and times per day)? As prescribed  3. Are you having a reaction (difficulty breathing--STAT)?   No  4. What is your medication issue?   Patient stated she was recently released from [facility] and they increased this medication and she is concerned if she should continue on the increased dosage.

## 2022-11-09 NOTE — Telephone Encounter (Signed)
Wendy Brown, Wendy Cheng, MD  You15 hours ago (5:22 PM)   Yes, She should continue the meds she was prescribed by the doctors at [facility] hospital  PN   Returned call to patient this morning who verblaized understanding to continue medications, including Metoprolol, as prescribed upon discharge from hospital. Informed her that we would like to see her as soon as she's available after her surgery to get her in for a visit to "regroup" on her care. MAR updated at this time.

## 2023-01-17 ENCOUNTER — Telehealth: Payer: Self-pay | Admitting: Cardiovascular Disease

## 2023-01-17 MED ORDER — METOPROLOL SUCCINATE ER 50 MG PO TB24: 50.0000 mg | ORAL_TABLET | Freq: Every day | ORAL | 3 refills | Status: AC

## 2023-01-17 NOTE — Telephone Encounter (Signed)
  Pt c/o medication issue:  1. Name of Medication: Metoprolol  2. How are you currently taking this medication (dosage and times per day)?   3. Are you having a reaction (difficulty breathing--STAT)? No   4. What is your medication issue?  Pt said, she was in the hospital recently. She said, they changed her metoprolol and wanted to speak with Dr. Elmarie Shiley nurse  to discuss

## 2023-01-17 NOTE — Telephone Encounter (Signed)
Me   11/09/22  9:22 AM Note Nahser, Wonda Cheng, MD  You15 hours ago (5:22 PM)    Yes, She should continue the meds she was prescribed by the doctors at [facility] hospital  PN    Returned call to patient this morning who verblaized understanding to continue medications, including Metoprolol, as prescribed upon discharge from hospital. Informed her that we would like to see her as soon as she's available after her surgery to get her in for a visit to "regroup" on her care. MAR updated at this time.      Metoprolol sent to pharmacy as requested.

## 2023-03-11 DIAGNOSIS — M25462 Effusion, left knee: Secondary | ICD-10-CM | POA: Diagnosis not present

## 2023-03-11 DIAGNOSIS — M25562 Pain in left knee: Secondary | ICD-10-CM | POA: Diagnosis not present

## 2023-03-14 ENCOUNTER — Other Ambulatory Visit: Payer: Self-pay | Admitting: Cardiovascular Disease

## 2023-03-14 DIAGNOSIS — I4891 Unspecified atrial fibrillation: Secondary | ICD-10-CM

## 2023-03-14 NOTE — Telephone Encounter (Signed)
Prescription refill request for Eliquis received.  Indication: afib  Last office visit: Nahser, 08/16/2023 Scr: 0.76, 08/15/2022 Age: 67 Weight:  67.7 kg   Refill sent.

## 2023-04-08 DIAGNOSIS — M170 Bilateral primary osteoarthritis of knee: Secondary | ICD-10-CM | POA: Diagnosis not present

## 2023-04-16 DIAGNOSIS — M170 Bilateral primary osteoarthritis of knee: Secondary | ICD-10-CM | POA: Diagnosis not present

## 2023-04-23 DIAGNOSIS — Z09 Encounter for follow-up examination after completed treatment for conditions other than malignant neoplasm: Secondary | ICD-10-CM | POA: Diagnosis not present

## 2023-04-23 DIAGNOSIS — Z79899 Other long term (current) drug therapy: Secondary | ICD-10-CM | POA: Diagnosis not present

## 2023-04-23 DIAGNOSIS — I4891 Unspecified atrial fibrillation: Secondary | ICD-10-CM | POA: Diagnosis not present

## 2023-04-23 DIAGNOSIS — M170 Bilateral primary osteoarthritis of knee: Secondary | ICD-10-CM | POA: Diagnosis not present

## 2023-04-23 DIAGNOSIS — Z91040 Latex allergy status: Secondary | ICD-10-CM | POA: Diagnosis not present

## 2023-04-23 DIAGNOSIS — R928 Other abnormal and inconclusive findings on diagnostic imaging of breast: Secondary | ICD-10-CM | POA: Diagnosis not present

## 2023-04-23 DIAGNOSIS — Z7901 Long term (current) use of anticoagulants: Secondary | ICD-10-CM | POA: Diagnosis not present

## 2023-04-23 DIAGNOSIS — Z170 Estrogen receptor positive status [ER+]: Secondary | ICD-10-CM | POA: Diagnosis not present

## 2023-04-23 DIAGNOSIS — Z888 Allergy status to other drugs, medicaments and biological substances status: Secondary | ICD-10-CM | POA: Diagnosis not present

## 2023-04-23 DIAGNOSIS — J449 Chronic obstructive pulmonary disease, unspecified: Secondary | ICD-10-CM | POA: Diagnosis not present

## 2023-04-23 DIAGNOSIS — C50512 Malignant neoplasm of lower-outer quadrant of left female breast: Secondary | ICD-10-CM | POA: Diagnosis not present

## 2023-04-23 DIAGNOSIS — C50912 Malignant neoplasm of unspecified site of left female breast: Secondary | ICD-10-CM | POA: Diagnosis not present

## 2023-04-23 DIAGNOSIS — Z9013 Acquired absence of bilateral breasts and nipples: Secondary | ICD-10-CM | POA: Diagnosis not present

## 2023-04-29 DIAGNOSIS — Z0000 Encounter for general adult medical examination without abnormal findings: Secondary | ICD-10-CM | POA: Diagnosis not present

## 2023-04-29 DIAGNOSIS — D6869 Other thrombophilia: Secondary | ICD-10-CM | POA: Diagnosis not present

## 2023-04-29 DIAGNOSIS — Z1211 Encounter for screening for malignant neoplasm of colon: Secondary | ICD-10-CM | POA: Diagnosis not present

## 2023-04-29 DIAGNOSIS — C50919 Malignant neoplasm of unspecified site of unspecified female breast: Secondary | ICD-10-CM | POA: Diagnosis not present

## 2023-04-29 DIAGNOSIS — M810 Age-related osteoporosis without current pathological fracture: Secondary | ICD-10-CM | POA: Diagnosis not present

## 2023-04-29 DIAGNOSIS — Z9013 Acquired absence of bilateral breasts and nipples: Secondary | ICD-10-CM | POA: Diagnosis not present

## 2023-04-29 DIAGNOSIS — I480 Paroxysmal atrial fibrillation: Secondary | ICD-10-CM | POA: Diagnosis not present

## 2023-04-30 DIAGNOSIS — M170 Bilateral primary osteoarthritis of knee: Secondary | ICD-10-CM | POA: Diagnosis not present

## 2023-06-12 DIAGNOSIS — C50512 Malignant neoplasm of lower-outer quadrant of left female breast: Secondary | ICD-10-CM | POA: Diagnosis not present

## 2023-06-12 DIAGNOSIS — Z170 Estrogen receptor positive status [ER+]: Secondary | ICD-10-CM | POA: Diagnosis not present

## 2023-07-16 DIAGNOSIS — Z853 Personal history of malignant neoplasm of breast: Secondary | ICD-10-CM | POA: Diagnosis not present

## 2023-07-16 DIAGNOSIS — Z888 Allergy status to other drugs, medicaments and biological substances status: Secondary | ICD-10-CM | POA: Diagnosis not present

## 2023-07-16 DIAGNOSIS — I110 Hypertensive heart disease with heart failure: Secondary | ICD-10-CM | POA: Diagnosis not present

## 2023-07-16 DIAGNOSIS — R928 Other abnormal and inconclusive findings on diagnostic imaging of breast: Secondary | ICD-10-CM | POA: Diagnosis not present

## 2023-07-16 DIAGNOSIS — Z91040 Latex allergy status: Secondary | ICD-10-CM | POA: Diagnosis not present

## 2023-07-16 DIAGNOSIS — E871 Hypo-osmolality and hyponatremia: Secondary | ICD-10-CM | POA: Diagnosis not present

## 2023-07-16 DIAGNOSIS — I4811 Longstanding persistent atrial fibrillation: Secondary | ICD-10-CM | POA: Diagnosis not present

## 2023-07-16 DIAGNOSIS — J90 Pleural effusion, not elsewhere classified: Secondary | ICD-10-CM | POA: Diagnosis not present

## 2023-07-16 DIAGNOSIS — Z87891 Personal history of nicotine dependence: Secondary | ICD-10-CM | POA: Diagnosis not present

## 2023-07-16 DIAGNOSIS — Z7901 Long term (current) use of anticoagulants: Secondary | ICD-10-CM | POA: Diagnosis not present

## 2023-07-16 DIAGNOSIS — I5033 Acute on chronic diastolic (congestive) heart failure: Secondary | ICD-10-CM | POA: Diagnosis not present

## 2023-07-16 DIAGNOSIS — R9431 Abnormal electrocardiogram [ECG] [EKG]: Secondary | ICD-10-CM | POA: Diagnosis not present

## 2023-07-16 DIAGNOSIS — E876 Hypokalemia: Secondary | ICD-10-CM | POA: Diagnosis not present

## 2023-07-16 DIAGNOSIS — J9691 Respiratory failure, unspecified with hypoxia: Secondary | ICD-10-CM | POA: Diagnosis not present

## 2023-07-16 DIAGNOSIS — I509 Heart failure, unspecified: Secondary | ICD-10-CM | POA: Diagnosis not present

## 2023-07-16 DIAGNOSIS — Z170 Estrogen receptor positive status [ER+]: Secondary | ICD-10-CM | POA: Diagnosis not present

## 2023-07-16 DIAGNOSIS — Z1152 Encounter for screening for COVID-19: Secondary | ICD-10-CM | POA: Diagnosis not present

## 2023-07-16 DIAGNOSIS — I4819 Other persistent atrial fibrillation: Secondary | ICD-10-CM | POA: Diagnosis not present

## 2023-07-16 DIAGNOSIS — R0602 Shortness of breath: Secondary | ICD-10-CM | POA: Diagnosis not present

## 2023-07-16 DIAGNOSIS — R0600 Dyspnea, unspecified: Secondary | ICD-10-CM | POA: Diagnosis not present

## 2023-07-16 DIAGNOSIS — C50911 Malignant neoplasm of unspecified site of right female breast: Secondary | ICD-10-CM | POA: Diagnosis not present

## 2023-07-16 DIAGNOSIS — J9602 Acute respiratory failure with hypercapnia: Secondary | ICD-10-CM | POA: Diagnosis not present

## 2023-07-16 DIAGNOSIS — I4891 Unspecified atrial fibrillation: Secondary | ICD-10-CM | POA: Diagnosis not present

## 2023-07-16 DIAGNOSIS — I10 Essential (primary) hypertension: Secondary | ICD-10-CM | POA: Diagnosis not present

## 2023-07-16 DIAGNOSIS — I482 Chronic atrial fibrillation, unspecified: Secondary | ICD-10-CM | POA: Diagnosis not present

## 2023-07-16 DIAGNOSIS — I272 Pulmonary hypertension, unspecified: Secondary | ICD-10-CM | POA: Diagnosis not present

## 2023-07-16 DIAGNOSIS — C50912 Malignant neoplasm of unspecified site of left female breast: Secondary | ICD-10-CM | POA: Diagnosis not present

## 2023-07-16 DIAGNOSIS — I5031 Acute diastolic (congestive) heart failure: Secondary | ICD-10-CM | POA: Diagnosis not present

## 2023-07-16 DIAGNOSIS — I959 Hypotension, unspecified: Secondary | ICD-10-CM | POA: Diagnosis not present

## 2023-07-16 DIAGNOSIS — J9601 Acute respiratory failure with hypoxia: Secondary | ICD-10-CM | POA: Diagnosis not present

## 2023-07-16 DIAGNOSIS — I480 Paroxysmal atrial fibrillation: Secondary | ICD-10-CM | POA: Diagnosis not present

## 2023-07-16 DIAGNOSIS — Z79899 Other long term (current) drug therapy: Secondary | ICD-10-CM | POA: Diagnosis not present

## 2023-07-16 DIAGNOSIS — C50512 Malignant neoplasm of lower-outer quadrant of left female breast: Secondary | ICD-10-CM | POA: Diagnosis not present

## 2023-07-20 DIAGNOSIS — I4891 Unspecified atrial fibrillation: Secondary | ICD-10-CM | POA: Diagnosis not present

## 2023-07-20 DIAGNOSIS — I480 Paroxysmal atrial fibrillation: Secondary | ICD-10-CM | POA: Diagnosis not present

## 2023-07-20 DIAGNOSIS — Z853 Personal history of malignant neoplasm of breast: Secondary | ICD-10-CM | POA: Diagnosis not present

## 2023-07-20 DIAGNOSIS — I4819 Other persistent atrial fibrillation: Secondary | ICD-10-CM | POA: Diagnosis not present

## 2023-07-20 DIAGNOSIS — I959 Hypotension, unspecified: Secondary | ICD-10-CM | POA: Diagnosis not present

## 2023-07-20 DIAGNOSIS — I5033 Acute on chronic diastolic (congestive) heart failure: Secondary | ICD-10-CM | POA: Diagnosis not present

## 2023-07-20 DIAGNOSIS — I272 Pulmonary hypertension, unspecified: Secondary | ICD-10-CM | POA: Diagnosis not present

## 2023-07-20 DIAGNOSIS — I10 Essential (primary) hypertension: Secondary | ICD-10-CM | POA: Diagnosis not present

## 2023-07-20 DIAGNOSIS — Z7901 Long term (current) use of anticoagulants: Secondary | ICD-10-CM | POA: Diagnosis not present

## 2023-07-20 DIAGNOSIS — J9601 Acute respiratory failure with hypoxia: Secondary | ICD-10-CM | POA: Diagnosis not present

## 2023-07-20 DIAGNOSIS — I110 Hypertensive heart disease with heart failure: Secondary | ICD-10-CM | POA: Diagnosis not present

## 2023-07-20 DIAGNOSIS — I482 Chronic atrial fibrillation, unspecified: Secondary | ICD-10-CM | POA: Diagnosis not present

## 2023-07-20 DIAGNOSIS — I4811 Longstanding persistent atrial fibrillation: Secondary | ICD-10-CM | POA: Diagnosis not present

## 2023-07-20 DIAGNOSIS — R9431 Abnormal electrocardiogram [ECG] [EKG]: Secondary | ICD-10-CM | POA: Diagnosis not present

## 2023-07-20 DIAGNOSIS — J9602 Acute respiratory failure with hypercapnia: Secondary | ICD-10-CM | POA: Diagnosis not present

## 2023-07-24 DIAGNOSIS — I10 Essential (primary) hypertension: Secondary | ICD-10-CM | POA: Diagnosis not present

## 2023-07-24 DIAGNOSIS — I272 Pulmonary hypertension, unspecified: Secondary | ICD-10-CM | POA: Diagnosis not present

## 2023-07-24 DIAGNOSIS — I4819 Other persistent atrial fibrillation: Secondary | ICD-10-CM | POA: Diagnosis not present

## 2023-07-24 DIAGNOSIS — Z7901 Long term (current) use of anticoagulants: Secondary | ICD-10-CM | POA: Diagnosis not present

## 2023-07-25 DIAGNOSIS — I272 Pulmonary hypertension, unspecified: Secondary | ICD-10-CM | POA: Diagnosis not present

## 2023-07-25 DIAGNOSIS — I4819 Other persistent atrial fibrillation: Secondary | ICD-10-CM | POA: Diagnosis not present

## 2023-07-25 DIAGNOSIS — Z7901 Long term (current) use of anticoagulants: Secondary | ICD-10-CM | POA: Diagnosis not present

## 2023-07-25 DIAGNOSIS — I10 Essential (primary) hypertension: Secondary | ICD-10-CM | POA: Diagnosis not present

## 2023-07-30 DIAGNOSIS — R0602 Shortness of breath: Secondary | ICD-10-CM | POA: Diagnosis not present

## 2023-07-30 DIAGNOSIS — J90 Pleural effusion, not elsewhere classified: Secondary | ICD-10-CM | POA: Diagnosis not present

## 2023-08-15 DIAGNOSIS — I272 Pulmonary hypertension, unspecified: Secondary | ICD-10-CM | POA: Diagnosis not present

## 2023-08-15 DIAGNOSIS — I509 Heart failure, unspecified: Secondary | ICD-10-CM | POA: Diagnosis not present

## 2023-08-15 DIAGNOSIS — Z7901 Long term (current) use of anticoagulants: Secondary | ICD-10-CM | POA: Diagnosis not present

## 2023-08-15 DIAGNOSIS — Z79899 Other long term (current) drug therapy: Secondary | ICD-10-CM | POA: Diagnosis not present

## 2023-08-15 DIAGNOSIS — I4891 Unspecified atrial fibrillation: Secondary | ICD-10-CM | POA: Diagnosis not present

## 2023-08-21 ENCOUNTER — Other Ambulatory Visit: Payer: Self-pay | Admitting: Cardiovascular Disease

## 2023-08-26 DIAGNOSIS — M1712 Unilateral primary osteoarthritis, left knee: Secondary | ICD-10-CM | POA: Diagnosis not present

## 2023-08-27 DIAGNOSIS — C50512 Malignant neoplasm of lower-outer quadrant of left female breast: Secondary | ICD-10-CM | POA: Diagnosis not present

## 2023-08-27 DIAGNOSIS — Z170 Estrogen receptor positive status [ER+]: Secondary | ICD-10-CM | POA: Diagnosis not present

## 2023-08-27 DIAGNOSIS — R928 Other abnormal and inconclusive findings on diagnostic imaging of breast: Secondary | ICD-10-CM | POA: Diagnosis not present

## 2023-09-04 DIAGNOSIS — I4891 Unspecified atrial fibrillation: Secondary | ICD-10-CM | POA: Diagnosis not present

## 2023-09-09 DIAGNOSIS — I4891 Unspecified atrial fibrillation: Secondary | ICD-10-CM | POA: Diagnosis not present

## 2023-09-12 DIAGNOSIS — R9431 Abnormal electrocardiogram [ECG] [EKG]: Secondary | ICD-10-CM | POA: Diagnosis not present

## 2023-09-12 DIAGNOSIS — I480 Paroxysmal atrial fibrillation: Secondary | ICD-10-CM | POA: Diagnosis not present

## 2023-09-20 ENCOUNTER — Other Ambulatory Visit: Payer: Self-pay | Admitting: Cardiovascular Disease

## 2023-09-20 DIAGNOSIS — I4891 Unspecified atrial fibrillation: Secondary | ICD-10-CM

## 2023-09-23 ENCOUNTER — Telehealth: Payer: Self-pay | Admitting: Cardiovascular Disease

## 2023-09-23 NOTE — Telephone Encounter (Signed)
Prescription refill request for Eliquis received. Indication: PAF Last office visit: 08/15/22  P Nahser MD (appt 09/30/23) Scr: 1.15 on 08/15/23 Age: 67 Weight: 67.7kg  Based on above findings Eliquis 5mg  twice daily is the appropriate dose.  Refill approved.

## 2023-09-23 NOTE — Telephone Encounter (Signed)
Patient cancelled her upcoming appointment with Dr. Elease Hashimoto and requested his nurse call her back. She says that it is in regards to finding a cardiologist in [place].

## 2023-09-26 NOTE — Telephone Encounter (Signed)
Returned call to patient who states she has found a cardiologist in [place], [place] named Fraser Din in [place]. She wanted to know if Dr Elease Hashimoto knew of him and if so, what his thoughts are. States if he has no knowledge of him, there's really no need to call her back.

## 2023-09-30 ENCOUNTER — Ambulatory Visit: Payer: Medicare Other | Admitting: Cardiovascular Disease

## 2024-03-29 ENCOUNTER — Other Ambulatory Visit: Payer: Self-pay | Admitting: Cardiovascular Disease

## 2024-03-29 DIAGNOSIS — I4891 Unspecified atrial fibrillation: Secondary | ICD-10-CM

## 2024-03-30 NOTE — Telephone Encounter (Addendum)
 Eliquis  5mg  refill request received. Patient is 68 years old, weight-67.7kg, Crea-0.76 on 08/15/22, Diagnosis-Afib, and last seen by Dr. Alroy Aspen on 08/15/22. Dose is appropriate based on dosing criteria.   Per 09/23/23 note pt has a Development worker, international aid in [place]   named Dr. Johnathan Myron. Will call the pharmacy to have them send to new Cardiologist.  Called the pharmacy and advised pt is not being seen by our Cardiologist anymore and being seen by Dr. Johnathan Myron; she states she would send to them at this time.

## 2024-04-08 ENCOUNTER — Other Ambulatory Visit: Payer: Self-pay | Admitting: Cardiovascular Disease

## 2024-04-08 DIAGNOSIS — I4891 Unspecified atrial fibrillation: Secondary | ICD-10-CM

## 2024-04-08 NOTE — Telephone Encounter (Signed)
 Called and spoke with pt. Pt states she now sees Dr Alanda Hoyles with [facility]. Called [employer] and spoke with Odie Benne. Made them aware refill request should be sent to current cardiologist.

## 2024-04-08 NOTE — Telephone Encounter (Signed)
 Prescription refill request for Eliquis  received. Indication: Afib  Last office visit:  08/15/22 (Nahser)  Scr: 1.15 (08/15/23 via [facility])  Age: 68 Weight: 67.7kg  Office visit overdue. Message sent to schedule.
